# Patient Record
Sex: Female | Born: 1950 | Race: Black or African American | Hispanic: No | Marital: Married | Smoking: Never smoker
Health system: Southern US, Community
[De-identification: ages and names within clinical notes are randomized; demographics above are authoritative.]

## PROBLEM LIST (undated history)

## (undated) DIAGNOSIS — I447 Left bundle-branch block, unspecified: Secondary | ICD-10-CM

## (undated) DIAGNOSIS — I10 Essential (primary) hypertension: Secondary | ICD-10-CM

## (undated) DIAGNOSIS — E119 Type 2 diabetes mellitus without complications: Secondary | ICD-10-CM

## (undated) HISTORY — PX: ABDOMINAL HYSTERECTOMY: SHX81

---

## 2004-08-28 ENCOUNTER — Ambulatory Visit: Payer: Self-pay | Admitting: Family Medicine

## 2004-09-23 ENCOUNTER — Ambulatory Visit: Payer: Self-pay | Admitting: Family Medicine

## 2005-12-26 ENCOUNTER — Emergency Department: Payer: Self-pay | Admitting: Unknown Physician Specialty

## 2005-12-27 ENCOUNTER — Emergency Department: Payer: Self-pay | Admitting: Emergency Medicine

## 2005-12-27 ENCOUNTER — Other Ambulatory Visit: Payer: Self-pay

## 2006-02-21 ENCOUNTER — Ambulatory Visit: Payer: Self-pay | Admitting: Family Medicine

## 2006-10-28 ENCOUNTER — Ambulatory Visit: Payer: Self-pay | Admitting: Family Medicine

## 2006-10-31 ENCOUNTER — Emergency Department: Payer: Self-pay | Admitting: Emergency Medicine

## 2006-10-31 ENCOUNTER — Other Ambulatory Visit: Payer: Self-pay
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
## 2007-08-04 ENCOUNTER — Ambulatory Visit: Payer: Self-pay | Admitting: Family Medicine

## 2008-01-21 ENCOUNTER — Emergency Department: Payer: Self-pay | Admitting: Emergency Medicine

## 2008-03-05 ENCOUNTER — Ambulatory Visit: Payer: Self-pay | Admitting: Family Medicine

## 2008-04-27 ENCOUNTER — Ambulatory Visit: Payer: Self-pay | Admitting: Family Medicine

## 2008-10-18 ENCOUNTER — Emergency Department: Payer: Self-pay | Admitting: Emergency Medicine

## 2010-03-24 ENCOUNTER — Ambulatory Visit: Payer: Self-pay | Admitting: Internal Medicine

## 2010-11-27 ENCOUNTER — Observation Stay (HOSPITAL_COMMUNITY)
Admission: EM | Admit: 2010-11-27 | Discharge: 2010-11-28 | Payer: Self-pay | Source: Home / Self Care | Attending: Cardiology | Admitting: Cardiology

## 2010-11-27 LAB — BASIC METABOLIC PANEL
BUN: 12 mg/dL (ref 6–23)
CO2: 24 mEq/L (ref 19–32)
Calcium: 9.3 mg/dL (ref 8.4–10.5)
Chloride: 109 mEq/L (ref 96–112)
Creatinine, Ser: 0.64 mg/dL (ref 0.4–1.2)
GFR calc Af Amer: >60 mL/min (ref >60)
GFR calc non Af Amer: >60 mL/min (ref >60)
Glucose, Bld: 90 mg/dL (ref 70–99)
Potassium: 4.1 mEq/L (ref 3.5–5.1)
Sodium: 140 mEq/L (ref 135–145)

## 2010-11-27 LAB — POCT CARDIAC MARKERS
CKMB, poc: <1 ng/mL — ABNORMAL LOW (ref 1.0–8.0)
CKMB, poc: <1 ng/mL — ABNORMAL LOW (ref 1.0–8.0)
Myoglobin, poc: 39.3 ng/mL (ref 12–200)
Myoglobin, poc: 48 ng/mL (ref 12–200)
Troponin i, poc: <0.05 ng/mL (ref 0.00–0.09)
Troponin i, poc: <0.05 ng/mL (ref 0.00–0.09)

## 2010-11-27 LAB — DIFFERENTIAL
Basophils Absolute: 0 10*3/uL (ref 0.0–0.1)
Basophils Relative: 0 % (ref 0–1)
Eosinophils Absolute: 0.3 10*3/uL (ref 0.0–0.7)
Eosinophils Relative: 6 % — ABNORMAL HIGH (ref 0–5)
Lymphocytes Relative: 36 % (ref 12–46)
Lymphs Abs: 1.8 10*3/uL (ref 0.7–4.0)
Monocytes Absolute: 0.2 10*3/uL (ref 0.1–1.0)
Monocytes Relative: 5 % (ref 3–12)
Neutro Abs: 2.6 10*3/uL (ref 1.7–7.7)
Neutrophils Relative %: 53 % (ref 43–77)

## 2010-11-27 LAB — TROPONIN I: Troponin I: 0.02 ng/mL (ref 0.00–0.06)

## 2010-11-27 LAB — CK TOTAL AND CKMB (NOT AT ARMC)
CK, MB: 0.8 ng/mL (ref 0.3–4.0)
Relative Index: INVALID (ref 0.0–2.5)
Total CK: 60 U/L (ref 7–177)

## 2010-11-27 LAB — CBC
HCT: 37 % (ref 36.0–46.0)
Hemoglobin: 11.9 g/dL — ABNORMAL LOW (ref 12.0–15.0)
MCH: 30.4 pg (ref 26.0–34.0)
MCHC: 32.2 g/dL (ref 30.0–36.0)
MCV: 94.4 fL (ref 78.0–100.0)
Platelets: 214 10*3/uL (ref 150–400)
RBC: 3.92 MIL/uL (ref 3.87–5.11)
RDW: 13 % (ref 11.5–15.5)
WBC: 4.9 10*3/uL (ref 4.0–10.5)

## 2010-11-27 LAB — GLUCOSE, CAPILLARY: Glucose-Capillary: 172 mg/dL — ABNORMAL HIGH (ref 70–99)

## 2010-11-27 LAB — BRAIN NATRIURETIC PEPTIDE: Pro B Natriuretic peptide (BNP): <30 pg/mL (ref 0.0–100.0)

## 2010-11-28 LAB — LIPID PANEL
Cholesterol: 194 mg/dL (ref 0–200)
HDL: 44 mg/dL (ref >39)
LDL Cholesterol: 134 mg/dL — ABNORMAL HIGH (ref 0–99)
Total CHOL/HDL Ratio: 4.4 RATIO
Triglycerides: 78 mg/dL (ref <150)
VLDL: 16 mg/dL (ref 0–40)

## 2010-11-28 LAB — BASIC METABOLIC PANEL
BUN: 15 mg/dL (ref 6–23)
CO2: 25 mEq/L (ref 19–32)
Calcium: 9 mg/dL (ref 8.4–10.5)
Chloride: 108 mEq/L (ref 96–112)
Creatinine, Ser: 0.72 mg/dL (ref 0.4–1.2)
GFR calc Af Amer: >60 mL/min (ref >60)
GFR calc non Af Amer: >60 mL/min (ref >60)
Glucose, Bld: 121 mg/dL — ABNORMAL HIGH (ref 70–99)
Potassium: 4 mEq/L (ref 3.5–5.1)
Sodium: 142 mEq/L (ref 135–145)

## 2010-11-28 LAB — CARDIAC PANEL(CRET KIN+CKTOT+MB+TROPI)
CK, MB: 1 ng/mL (ref 0.3–4.0)
CK, MB: 0.8 ng/mL (ref 0.3–4.0)
Relative Index: INVALID (ref 0.0–2.5)
Relative Index: INVALID (ref 0.0–2.5)
Total CK: 56 U/L (ref 7–177)
Total CK: 54 U/L (ref 7–177)
Troponin I: 0.02 ng/mL (ref 0.00–0.06)
Troponin I: 0.01 ng/mL (ref 0.00–0.06)

## 2010-11-28 LAB — TSH: TSH: 2.455 u[IU]/mL (ref 0.350–4.500)

## 2010-11-28 LAB — GLUCOSE, CAPILLARY
Glucose-Capillary: 116 mg/dL — ABNORMAL HIGH (ref 70–99)
Glucose-Capillary: 129 mg/dL — ABNORMAL HIGH (ref 70–99)
Glucose-Capillary: 150 mg/dL — ABNORMAL HIGH (ref 70–99)

## 2010-11-28 LAB — CBC
HCT: 34.7 % — ABNORMAL LOW (ref 36.0–46.0)
Hemoglobin: 11.4 g/dL — ABNORMAL LOW (ref 12.0–15.0)
MCH: 31.1 pg (ref 26.0–34.0)
MCHC: 32.9 g/dL (ref 30.0–36.0)
MCV: 94.8 fL (ref 78.0–100.0)
Platelets: 212 10*3/uL (ref 150–400)
RBC: 3.66 MIL/uL — ABNORMAL LOW (ref 3.87–5.11)
RDW: 13.1 % (ref 11.5–15.5)
WBC: 4.1 10*3/uL (ref 4.0–10.5)

## 2010-12-01 ENCOUNTER — Ambulatory Visit (HOSPITAL_COMMUNITY)
Admission: RE | Admit: 2010-12-01 | Discharge: 2010-12-01 | Payer: Self-pay | Source: Home / Self Care | Attending: Cardiology | Admitting: Cardiology

## 2010-12-08 LAB — GLUCOSE, CAPILLARY: Glucose-Capillary: 104 mg/dL — ABNORMAL HIGH (ref 70–99)

## 2011-08-19 ENCOUNTER — Emergency Department: Payer: Self-pay | Admitting: Unknown Physician Specialty

## 2012-01-28 ENCOUNTER — Ambulatory Visit: Payer: Self-pay | Admitting: Internal Medicine

## 2012-02-09 ENCOUNTER — Ambulatory Visit: Payer: Self-pay | Admitting: Internal Medicine

## 2012-03-09 ENCOUNTER — Ambulatory Visit: Payer: Self-pay | Admitting: Surgery

## 2012-06-16 ENCOUNTER — Emergency Department: Payer: Self-pay | Admitting: *Deleted

## 2012-06-16 LAB — COMPREHENSIVE METABOLIC PANEL
Albumin: 3.8 g/dL (ref 3.4–5.0)
Alkaline Phosphatase: 83 U/L (ref 50–136)
Anion Gap: 9 (ref 7–16)
BUN: 15 mg/dL (ref 7–18)
Bilirubin,Total: 0.5 mg/dL (ref 0.2–1.0)
Calcium, Total: 8.9 mg/dL (ref 8.5–10.1)
Chloride: 108 mmol/L — ABNORMAL HIGH (ref 98–107)
Co2: 25 mmol/L (ref 21–32)
Creatinine: 0.83 mg/dL (ref 0.60–1.30)
EGFR (African American): >60
EGFR (Non-African Amer.): >60
Glucose: 203 mg/dL — ABNORMAL HIGH (ref 65–99)
Osmolality: 290 (ref 275–301)
Potassium: 3.5 mmol/L (ref 3.5–5.1)
SGOT(AST): 20 U/L (ref 15–37)
SGPT (ALT): 24 U/L
Sodium: 142 mmol/L (ref 136–145)
Total Protein: 7.2 g/dL (ref 6.4–8.2)

## 2012-06-16 LAB — URINALYSIS, COMPLETE
Bilirubin,UR: NEGATIVE
Blood: NEGATIVE
Glucose,UR: 150 mg/dL (ref 0–75)
Hyaline Cast: 3
Nitrite: NEGATIVE
Ph: 5 (ref 4.5–8.0)
Protein: NEGATIVE
RBC,UR: 2 /HPF (ref 0–5)
Specific Gravity: 1.028 (ref 1.003–1.030)
Squamous Epithelial: 6
WBC UR: 9 /HPF (ref 0–5)

## 2012-06-16 LAB — CBC
HCT: 35.7 % (ref 35.0–47.0)
HGB: 12.2 g/dL (ref 12.0–16.0)
MCH: 32.5 pg (ref 26.0–34.0)
MCHC: 34.3 g/dL (ref 32.0–36.0)
MCV: 95 fL (ref 80–100)
Platelet: 228 10*3/uL (ref 150–440)
RBC: 3.76 10*6/uL — ABNORMAL LOW (ref 3.80–5.20)
RDW: 13.1 % (ref 11.5–14.5)
WBC: 5.5 10*3/uL (ref 3.6–11.0)

## 2012-06-16 LAB — TROPONIN I: Troponin-I: <0.02 ng/mL

## 2012-11-05 ENCOUNTER — Emergency Department: Payer: Self-pay | Admitting: Emergency Medicine

## 2012-11-05 LAB — CBC
HCT: 39.8 % (ref 35.0–47.0)
HGB: 13.8 g/dL (ref 12.0–16.0)
MCH: 32.8 pg (ref 26.0–34.0)
MCV: 94 fL (ref 80–100)
Platelet: 185 10*3/uL (ref 150–440)
RBC: 4.22 10*6/uL (ref 3.80–5.20)
RDW: 13.1 % (ref 11.5–14.5)
WBC: 5.7 10*3/uL (ref 3.6–11.0)

## 2012-11-05 LAB — URINALYSIS, COMPLETE
Blood: NEGATIVE
Glucose,UR: >=500 mg/dL (ref 0–75)
Hyaline Cast: 4
Ph: 5 (ref 4.5–8.0)
Protein: NEGATIVE
Specific Gravity: 1.03 (ref 1.003–1.030)

## 2012-11-05 LAB — COMPREHENSIVE METABOLIC PANEL
Albumin: 3.8 g/dL (ref 3.4–5.0)
Alkaline Phosphatase: 103 U/L (ref 50–136)
Anion Gap: 9 (ref 7–16)
BUN: 26 mg/dL — ABNORMAL HIGH (ref 7–18)
Bilirubin,Total: 0.4 mg/dL (ref 0.2–1.0)
Calcium, Total: 9.1 mg/dL (ref 8.5–10.1)
Chloride: 106 mmol/L (ref 98–107)
Co2: 22 mmol/L (ref 21–32)
Creatinine: 0.94 mg/dL (ref 0.60–1.30)
EGFR (African American): >60
EGFR (Non-African Amer.): >60
Glucose: 387 mg/dL — ABNORMAL HIGH (ref 65–99)
Osmolality: 295 (ref 275–301)
Potassium: 4.2 mmol/L (ref 3.5–5.1)
SGOT(AST): 17 U/L (ref 15–37)
SGPT (ALT): 22 U/L (ref 12–78)
Sodium: 137 mmol/L (ref 136–145)
Total Protein: 7.3 g/dL (ref 6.4–8.2)

## 2012-11-05 LAB — TROPONIN I: Troponin-I: <0.02 ng/mL

## 2012-11-05 LAB — CK TOTAL AND CKMB (NOT AT ARMC)
CK, Total: 57 U/L (ref 21–215)
CK-MB: 0.6 ng/mL (ref 0.5–3.6)

## 2013-12-13 ENCOUNTER — Ambulatory Visit: Payer: Self-pay | Admitting: Family Medicine

## 2014-04-13 ENCOUNTER — Ambulatory Visit: Payer: Self-pay | Admitting: Internal Medicine

## 2015-09-24 ENCOUNTER — Other Ambulatory Visit: Payer: Self-pay | Admitting: Internal Medicine

## 2015-09-24 DIAGNOSIS — R1013 Epigastric pain: Secondary | ICD-10-CM

## 2015-09-30 ENCOUNTER — Ambulatory Visit
Admission: RE | Admit: 2015-09-30 | Discharge: 2015-09-30 | Disposition: A | Discharge status: Home / Self Care | Payer: No Typology Code available for payment source | Source: Ambulatory Visit | Attending: Internal Medicine | Admitting: Internal Medicine

## 2015-09-30 DIAGNOSIS — R1013 Epigastric pain: Secondary | ICD-10-CM

## 2015-10-02 ENCOUNTER — Ambulatory Visit
Admission: RE | Admit: 2015-10-02 | Discharge: 2015-10-02 | Disposition: A | Discharge status: Home / Self Care | Payer: No Typology Code available for payment source | Source: Ambulatory Visit | Attending: Internal Medicine | Admitting: Internal Medicine

## 2015-10-02 DIAGNOSIS — N281 Cyst of kidney, acquired: Secondary | ICD-10-CM | POA: Diagnosis not present

## 2015-10-02 DIAGNOSIS — R1013 Epigastric pain: Secondary | ICD-10-CM | POA: Diagnosis present

## 2016-05-05 ENCOUNTER — Other Ambulatory Visit: Payer: Self-pay | Admitting: Internal Medicine

## 2016-05-05 DIAGNOSIS — R1900 Intra-abdominal and pelvic swelling, mass and lump, unspecified site: Secondary | ICD-10-CM

## 2016-05-13 ENCOUNTER — Ambulatory Visit
Admission: RE | Admit: 2016-05-13 | Discharge: 2016-05-13 | Disposition: A | Discharge status: Home / Self Care | Payer: BLUE CROSS/BLUE SHIELD | Source: Ambulatory Visit | Attending: Internal Medicine | Admitting: Internal Medicine

## 2016-05-13 DIAGNOSIS — R1900 Intra-abdominal and pelvic swelling, mass and lump, unspecified site: Secondary | ICD-10-CM | POA: Diagnosis not present

## 2016-09-02 ENCOUNTER — Emergency Department: Payer: BLUE CROSS/BLUE SHIELD

## 2016-09-02 ENCOUNTER — Emergency Department
Admission: EM | Admit: 2016-09-02 | Discharge: 2016-09-02 | Disposition: A | Discharge status: Home / Self Care | Payer: BLUE CROSS/BLUE SHIELD | Attending: Emergency Medicine | Admitting: Emergency Medicine

## 2016-09-02 ENCOUNTER — Encounter: Payer: Self-pay | Admitting: Emergency Medicine

## 2016-09-02 DIAGNOSIS — R072 Precordial pain: Secondary | ICD-10-CM | POA: Insufficient documentation

## 2016-09-02 DIAGNOSIS — R51 Headache: Secondary | ICD-10-CM | POA: Diagnosis not present

## 2016-09-02 DIAGNOSIS — E119 Type 2 diabetes mellitus without complications: Secondary | ICD-10-CM | POA: Diagnosis not present

## 2016-09-02 DIAGNOSIS — R079 Chest pain, unspecified: Secondary | ICD-10-CM

## 2016-09-02 DIAGNOSIS — I10 Essential (primary) hypertension: Secondary | ICD-10-CM | POA: Insufficient documentation

## 2016-09-02 HISTORY — DX: Essential (primary) hypertension: I10

## 2016-09-02 HISTORY — DX: Type 2 diabetes mellitus without complications: E11.9

## 2016-09-02 LAB — BASIC METABOLIC PANEL
ANION GAP: 6 (ref 5–15)
BUN: 21 mg/dL — ABNORMAL HIGH (ref 6–20)
CALCIUM: 9.4 mg/dL (ref 8.9–10.3)
CHLORIDE: 108 mmol/L (ref 101–111)
CO2: 24 mmol/L (ref 22–32)
Creatinine, Ser: 0.81 mg/dL (ref 0.44–1.00)
GFR calc Af Amer: >60 mL/min (ref >60)
GFR calc non Af Amer: >60 mL/min (ref >60)
GLUCOSE: 221 mg/dL — AB (ref 65–99)
POTASSIUM: 4 mmol/L (ref 3.5–5.1)
Sodium: 138 mmol/L (ref 135–145)

## 2016-09-02 LAB — CBC
HEMATOCRIT: 38.7 % (ref 35.0–47.0)
HEMOGLOBIN: 13.3 g/dL (ref 12.0–16.0)
MCH: 32.5 pg (ref 26.0–34.0)
MCHC: 34.3 g/dL (ref 32.0–36.0)
MCV: 94.8 fL (ref 80.0–100.0)
Platelets: 217 10*3/uL (ref 150–440)
RBC: 4.08 MIL/uL (ref 3.80–5.20)
RDW: 12.8 % (ref 11.5–14.5)
WBC: 5.3 10*3/uL (ref 3.6–11.0)

## 2016-09-02 LAB — TROPONIN I
Troponin I: <0.03 ng/mL (ref <0.03)
Troponin I: <0.03 ng/mL (ref <0.03)

## 2016-09-02 MED ORDER — NITROGLYCERIN 0.4 MG SL SUBL
0.4000 mg | SUBLINGUAL_TABLET | SUBLINGUAL | Status: DC | PRN
  Administered 2016-09-02: 0.4 mg via SUBLINGUAL
  Filled 2016-09-02: qty 1

## 2016-09-02 MED ORDER — ACETAMINOPHEN 325 MG PO TABS
ORAL_TABLET | ORAL | Status: AC
  Administered 2016-09-02: 650 mg via ORAL
  Filled 2016-09-02: qty 2

## 2016-09-02 MED ORDER — ACETAMINOPHEN 325 MG PO TABS
650.0000 mg | ORAL_TABLET | Freq: Once | ORAL | Status: AC
  Administered 2016-09-02: 650 mg via ORAL

## 2016-09-02 NOTE — ED Notes (Signed)
At present, pt is refusing to take the nitro tablet, pt states she wants to speak with her daughter first.. MD notified..will speak with pt after she has a chance to speak with her daughter..Marland Kitchen

## 2016-09-02 NOTE — ED Triage Notes (Signed)
Pt presents to ED c/o headache, chest pain, and R arm pain starting about 5 hours ago. Chest pain is 6/10, mid-sternal, non-radiating, worse when laying down. Pt states she thinks it is stress related.

## 2016-09-02 NOTE — ED Notes (Signed)
Pt informed to return if any life threatening symptoms occur.  

## 2016-09-02 NOTE — ED Provider Notes (Signed)
[facility] Emergency Department Provider Note   ____________________________________________   First MD Initiated Contact with Patient 09/02/16 1401     (approximate)  I have reviewed the triage vital signs and the nursing notes.   HISTORY  Chief Complaint Headache and Chest Pain   HPI Gina Hudson is a 65 y.o. female with a history of diabetes and hypertension as well as a left bundle-branch block was presenting to the emergency department today with chest pain. She says that she was very stressed this morning and was crying when she began to have a 6 out of 10 chest pain to the right side of her chest radiating to her right arm as well as her bilateral shoulders. She says that she also developed a mild headache at this time. Says the chest pain feels like a pressure. Denies any shortness of breath but says that she felt clammy during the onset of her chest pain. She says that it was initially 6 out of 10 but has now decreased to 5 out of 10. Initially she was not able to lie down because of the intensity of the pain but is now able to lie down. Has a history of heart attacks in her family. Says that she knows of her left bundle branch block and has had stress tests in the past that did not lead to any catheterization but the stress tests were about 5-6 years ago. She says the pain is been constant but has been decreasing over time.   Past Medical History:  Diagnosis Date  . Diabetes mellitus without complication (HCC)    type II  . Hypertension     There are no active problems to display for this patient.   Past Surgical History:  Procedure Laterality Date  . ABDOMINAL HYSTERECTOMY      Prior to Admission medications   Not on File    Allergies Prednisone  History reviewed. No pertinent family history.  Social History Social History  Substance Use Topics  . Smoking status: Never Smoker  . Smokeless tobacco: Never Used  . Alcohol use Yes       Comment: holidays    Review of Systems Constitutional: No fever/chills Eyes: No visual changes. ENT: No sore throat. Cardiovascular: as above Respiratory: Denies shortness of breath. Gastrointestinal: No abdominal pain.  No nausea, no vomiting.  No diarrhea.  No constipation. Genitourinary: Negative for dysuria. Musculoskeletal: Negative for back pain. Skin: Negative for rash. Neurological: Negative for, focal weakness or numbness. Mild and diffuse headache.  10-point ROS otherwise negative.  ____________________________________________   PHYSICAL EXAM:  VITAL SIGNS: ED Triage Vitals  Enc Vitals Group     BP 09/02/16 1320 136/67     Pulse Rate 09/02/16 1320 85     Resp 09/02/16 1320 18     Temp 09/02/16 1320 98.7 F (37.1 C)     Temp Source 09/02/16 1320 Oral     SpO2 09/02/16 1320 96 %     Weight 09/02/16 1321 195 lb (88.5 kg)     Height 09/02/16 1321 5\' 1"  (1.549 m)     Head Circumference --      Peak Flow --      Pain Score 09/02/16 1331 6     Pain Loc --      Pain Edu? --      Excl. in GC? --     Constitutional: Alert and oriented. Well appearing and in no acute distress. Eyes: Conjunctivae are normal. PERRL.  EOMI. Head: Atraumatic. Nose: No congestion/rhinnorhea. Mouth/Throat: Mucous membranes are moist.   Neck: No stridor.   Cardiovascular: Normal rate, regular rhythm. Grossly normal heart sounds.  Good peripheral circulation With intact, equal and bilateral radial as well as dorsalis pedis pulses. Chest pain is mildly reproducible to palpation. Respiratory: Normal respiratory effort.  No retractions. Lungs CTAB. Gastrointestinal: Soft and nontender. No distention.  Musculoskeletal: No lower extremity tenderness nor edema.  No joint effusions. Neurologic:  Normal speech and language. No gross focal neurologic deficits are appreciated. Skin:  Skin is warm, dry and intact. No rash noted. Psychiatric: Mood and affect are normal. Speech and behavior are  normal.  ____________________________________________   LABS (all labs ordered are listed, but only abnormal results are displayed)  Labs Reviewed  BASIC METABOLIC PANEL - Abnormal; Notable for the following:       Result Value   Glucose, Bld 221 (*)    BUN 21 (*)    All other components within normal limits  CBC  TROPONIN I  TROPONIN I   ____________________________________________  EKG  ED ECG REPORT I, Arelia LongestSchaevitz,  Pantera Winterrowd M, the attending physician, personally viewed and interpreted this ECG.   Date: 09/02/2016  EKG Time: 1320  Rate: 84  Rhythm: Normal sinus rhythm  Axis: Normal  Intervals:left bundle branch block  ST&T Change: T wave inversions in 1, 2, aVL as well as aVF. The T-wave inversions in 2 as well as aVF are new from previous. Also with a T-wave inversion in V6. T-wave inversion in V6 is seen on previous EKGs.  ____________________________________________  RADIOLOGY  DG Chest 2 View (Accession [id number]) (Order [id number])  Imaging  Date: 09/02/2016 Department: [facility] EMERGENCY DEPARTMENT Released By: Macario GoldsAlicia A Granger, RN (auto-released) Authorizing: Myrna Blazeravid Matthew Levonte Molina, MD  Exam Information   Status Exam Begun  Exam Ended   Final [99] 09/02/2016 1:57 PM 09/02/2016 2:12 PM  PACS Images   Show images for DG Chest 2 View  Study Result   CLINICAL DATA:  Pt presents to ED c/o headache, chest pain, and R arm pain starting about 5 hours ago. Chest pain is 6/10, mid-sternal, non-radiating, worse when laying down. Pt states she thinks it is stress related. Diabetes, HTN, nonsmoker  EXAM: CHEST - 2 VIEW  COMPARISON:  08/19/2011  FINDINGS: Low lung volumes. No focal infiltrate or overt edema. No pneumothorax.  Heart size upper limits normal.  Tortuous thoracic aorta.  No effusion.  Visualized bones unremarkable.  IMPRESSION: 1. Stable mild cardiomegaly.  No acute disease.   Electronically Signed   By:  Corlis Leak  Hassell M.D.   On: 09/02/2016 14:17     ____________________________________________   PROCEDURES  Procedure(s) performed:   Procedures  Critical Care performed:   ____________________________________________   INITIAL IMPRESSION / ASSESSMENT AND PLAN / ED COURSE  Pertinent labs & imaging results that were available during my care of the patient were reviewed by me and considered in my medical decision making (see chart for details).  ----------------------------------------- 3:13 PM on 09/02/2016 -----------------------------------------  Plan was to give patient nitroglycerin the patient is refusing at this time.  Clinical Course  Comment By Time  Patient took nitroglycerin tablet and says that the pain went from a 3 out of 10 to a 1 or 2 out of 10. Myrna Blazeravid Matthew Tomma Ehinger, MD 10/11 1547   ----------------------------------------- 5:13 PM on 09/02/2016 -----------------------------------------  After Tylenol the patient is symptom-free. She denies any pain at this time. I discussed the case with  Dr. Welton Flakes, her cardiologist. We discussed the new T-wave inversions in the context of her history physical and lab results. Dr. Lennette Bihari agrees that it will be appropriate for the patient to be seen as an outpatient and will see her at 845 tomorrow morning. I discussed this plan with the patient and she is understanding of the plan and willing to comply. We also discussed strict return precautions and that she should return to the hospital immediately for any worsening or concerning symptoms.   ____________________________________________   FINAL CLINICAL IMPRESSION(S) / ED DIAGNOSES  Chest pain.    NEW MEDICATIONS STARTED DURING THIS VISIT:  New Prescriptions   No medications on file     Note:  This document was prepared using Dragon voice recognition software and may include unintentional dictation errors.    Myrna Blazer, MD 09/02/16 1714

## 2017-04-02 ENCOUNTER — Ambulatory Visit: Payer: BLUE CROSS/BLUE SHIELD | Attending: Internal Medicine

## 2017-04-05 ENCOUNTER — Other Ambulatory Visit: Payer: Self-pay | Admitting: Internal Medicine

## 2017-04-05 DIAGNOSIS — N390 Urinary tract infection, site not specified: Secondary | ICD-10-CM

## 2017-04-09 ENCOUNTER — Other Ambulatory Visit: Payer: Self-pay | Admitting: Internal Medicine

## 2017-04-09 ENCOUNTER — Ambulatory Visit
Admission: RE | Admit: 2017-04-09 | Discharge: 2017-04-09 | Disposition: A | Discharge status: Home / Self Care | Payer: Medicare HMO | Source: Ambulatory Visit | Attending: Internal Medicine | Admitting: Internal Medicine

## 2017-04-09 DIAGNOSIS — N390 Urinary tract infection, site not specified: Secondary | ICD-10-CM

## 2017-04-09 DIAGNOSIS — N9489 Other specified conditions associated with female genital organs and menstrual cycle: Secondary | ICD-10-CM | POA: Insufficient documentation

## 2017-04-26 ENCOUNTER — Other Ambulatory Visit: Payer: Self-pay

## 2017-05-18 ENCOUNTER — Ambulatory Visit: Payer: Self-pay | Admitting: Urology

## 2017-06-08 ENCOUNTER — Encounter: Payer: Self-pay | Admitting: Urology

## 2017-06-08 ENCOUNTER — Ambulatory Visit: Payer: Medicare HMO | Admitting: Urology

## 2017-06-08 VITALS — BP 142/99 | HR 75 | Ht 62.0 in | Wt 195.7 lb

## 2017-06-08 DIAGNOSIS — N941 Unspecified dyspareunia: Secondary | ICD-10-CM | POA: Diagnosis not present

## 2017-06-08 DIAGNOSIS — N1339 Other hydronephrosis: Secondary | ICD-10-CM

## 2017-06-08 DIAGNOSIS — N390 Urinary tract infection, site not specified: Secondary | ICD-10-CM

## 2017-06-08 LAB — URINALYSIS, COMPLETE
BILIRUBIN UA: NEGATIVE
Glucose, UA: NEGATIVE
KETONES UA: NEGATIVE
LEUKOCYTES UA: NEGATIVE
NITRITE UA: NEGATIVE
Protein, UA: NEGATIVE
RBC UA: NEGATIVE
SPEC GRAV UA: 1.015 (ref 1.005–1.030)
UUROB: 0.2 mg/dL (ref 0.2–1.0)
pH, UA: 5.5 (ref 5.0–7.5)

## 2017-06-08 LAB — MICROSCOPIC EXAMINATION
Bacteria, UA: NONE SEEN
Epithelial Cells (non renal): >10 /hpf — ABNORMAL HIGH (ref 0–10)
RBC MICROSCOPIC, UA: NONE SEEN /HPF (ref 0–?)
WBC UA: NONE SEEN /HPF (ref 0–?)

## 2017-06-08 LAB — BLADDER SCAN AMB NON-IMAGING: SCAN RESULT: 9

## 2017-06-08 NOTE — Progress Notes (Signed)
06/08/2017 10:38 AM   Gina Hudson Mar 02, 1951 [id number]  Referring provider: Hyman HopesBurns, Harriett P, MD [address] [place], [place] [id number]  Chief Complaint  Patient presents with  . Urinary Tract Infection    New Patient    HPI: Patient referred for lower urinary tract symptoms. Going for about a year. She does not have dysuria but more of a spasm type pain that radiates down into her hands. She complains of urinary frequency and nocturia. She also has abdominal pain, pelvic pain and dyspareunia. On review of the referral notes I didn't see the urine culture but it is described as resistant to Cipro and Bactrim and therefore doxycycline was prescribed. Most of the time abx help. Sex seems to precipitate UTI. Her UA today is normal. She did undergo a renal ultrasound and I reviewed the images. She may have bilateral parapelvic cysts but it was difficult to differentiate that from hydronephrosis simply on the ultrasound. There were bilateral ureteral jets. No gross hematuria or flank pain.   Her freq and urgency is better today. She also refrained from sexual activity which seems to have helped. No other prior treatments. She has a "bloated stomach".  She has had a hx. No prolapse symptoms. No NG risk. No PN (has IDDM).   Modifying factors: There are no other modifying factors  Associated signs and symptoms: There are no other associated signs and symptoms Aggravating and relieving factors: There are no other aggravating or relieving factors Severity: Moderate Duration: Persistent  PVR today 9 mL.    PMH: Past Medical History:  Diagnosis Date  . Diabetes mellitus without complication (HCC)    type II  . Hypertension     Surgical History: Past Surgical History:  Procedure Laterality Date  . ABDOMINAL HYSTERECTOMY      Home Medications:  Allergies as of 06/08/2017      Reactions   Prednisone Other (See Comments)   hyperglycemia      Medication List         Accurate as of 06/08/17 10:38 AM. Always use your most recent med list.          aspirin EC 81 MG tablet Take 1 tablet by mouth daily.   insulin aspart 100 UNIT/ML FlexPen Commonly known as:  NOVOLOG Inject 3 Units into the skin daily at 12 noon.   LEVEMIR FLEXTOUCH [place] Inject 15 Units into the skin at bedtime.   losartan 100 MG tablet Commonly known as:  COZAAR Take 1 tablet by mouth daily.   metFORMIN 1000 MG tablet Commonly known as:  GLUCOPHAGE Take 1 tablet by mouth 2 (two) times daily.   metoprolol succinate 100 MG 24 hr tablet Commonly known as:  TOPROL-XL Take 1 tablet by mouth daily.   spironolactone 50 MG tablet Commonly known as:  ALDACTONE Take 1 tablet by mouth daily.       Allergies:  Allergies  Allergen Reactions  . Prednisone Other (See Comments)    hyperglycemia    Family History: No family history on file.  Social History:  reports that she has never smoked. She has never used smokeless tobacco. She reports that she drinks alcohol. She reports that she does not use drugs.  ROS: UROLOGY Frequent Urination?: Yes Hard to postpone urination?: No Burning/pain with urination?: No Get up at night to urinate?: Yes Leakage of urine?: No Urine stream starts and stops?: No Trouble starting stream?: No Do you have to strain to urinate?: No Blood in urine?:  No Urinary tract infection?: No Sexually transmitted disease?: No Injury to kidneys or bladder?: No Painful intercourse?: No Weak stream?: Yes Currently pregnant?: No Vaginal bleeding?: No Last menstrual period?: n  Gastrointestinal Nausea?: No Vomiting?: No Indigestion/heartburn?: No Diarrhea?: No Constipation?: No  Constitutional Fever: No Night sweats?: No Weight loss?: No Fatigue?: No  Skin Skin rash/lesions?: No Itching?: No  Eyes Blurred vision?: No Double vision?: No  Ears/Nose/Throat Sore throat?: No Sinus problems?: No  Hematologic/Lymphatic Swollen glands?:  No Easy bruising?: No  Cardiovascular Leg swelling?: Yes Chest pain?: Yes  Respiratory Cough?: No Shortness of breath?: No  Endocrine Excessive thirst?: No  Musculoskeletal Back pain?: No Joint pain?: No  Neurological Headaches?: No Dizziness?: No  Psychologic Depression?: No Anxiety?: No  Physical Exam: BP (!) 142/99 (BP Location: Left Arm, Patient Position: Sitting, Cuff Size: Large)   Pulse 75   Ht 5\' 2"  (1.575 m)   Wt 88.8 kg (195 lb 11.2 oz)   BMI 35.79 kg/m   Constitutional:  Alert and oriented, No acute distress. HEENT: [place] AT, moist mucus membranes.  Trachea midline, no masses. Cardiovascular: No clubbing, cyanosis, or edema. Respiratory: Normal respiratory effort, no increased work of breathing. GI: Abdomen is soft, nontender, nondistended, no abdominal masses GU: No CVA tenderness. Skin: No rashes, bruises or suspicious lesions. Lymph: No cervical adenopathy. Neurologic: Grossly intact, no focal deficits, moving all 4 extremities. Psychiatric: Normal mood and affect.  Laboratory Data: Lab Results  Component Value Date   WBC 5.3 09/02/2016   HGB 13.3 09/02/2016   HCT 38.7 09/02/2016   MCV 94.8 09/02/2016   PLT 217 09/02/2016    Lab Results  Component Value Date   CREATININE 0.81 09/02/2016    No results found for: PSA  No results found for: TESTOSTERONE  No results found for: HGBA1C  Urinalysis    Component Value Date/Time   COLORURINE Yellow 11/05/2012 1926   APPEARANCEUR Cloudy 11/05/2012 1926   LABSPEC 1.030 11/05/2012 1926   PHURINE 5.0 11/05/2012 1926   GLUCOSEU >=500 11/05/2012 1926   HGBUR Negative 11/05/2012 1926   BILIRUBINUR Negative 11/05/2012 1926   KETONESUR Trace 11/05/2012 1926   PROTEINUR Negative 11/05/2012 1926   NITRITE Negative 11/05/2012 1926   LEUKOCYTESUR Negative 11/05/2012 1926    Pertinent Imaging: Renal US   Assessment & Plan:   1. Recurrent UTI - resolved with antibiotic use. - Urinalysis,  Complete - BLADDER SCAN AMB NON-IMAGING  2. LUTS, dyspareunia-may or may not be related to UTI. Will monitor. She should come here for "UTI" symptoms so we can start tracking her UA and Cx. She would also benefit from pelvic floor PT and I'll let her PCP know. We could also consider post-coital abx if clearly related to sex but we would want some Cx data.   3. Hydronephrosis-pt asked about this and we discussed the nature r/b of [place] vs CT. We also discussed retrogrades. Will set her up for a CT scan with IV contrast.  Cc: Dr. Lawerance Bach   No Follow-up on file.  Jerilee Field, MD  [facility] [address], [address] [place], [place] [phone]

## 2017-06-23 ENCOUNTER — Ambulatory Visit
Admission: RE | Admit: 2017-06-23 | Discharge: 2017-06-23 | Disposition: A | Discharge status: Home / Self Care | Payer: Medicare HMO | Source: Ambulatory Visit | Attending: Urology | Admitting: Urology

## 2017-06-23 DIAGNOSIS — I700 Atherosclerosis of aorta: Secondary | ICD-10-CM | POA: Diagnosis not present

## 2017-06-23 DIAGNOSIS — Z90710 Acquired absence of both cervix and uterus: Secondary | ICD-10-CM | POA: Insufficient documentation

## 2017-06-23 DIAGNOSIS — N281 Cyst of kidney, acquired: Secondary | ICD-10-CM | POA: Insufficient documentation

## 2017-06-23 DIAGNOSIS — N390 Urinary tract infection, site not specified: Secondary | ICD-10-CM | POA: Insufficient documentation

## 2017-06-23 DIAGNOSIS — N941 Unspecified dyspareunia: Secondary | ICD-10-CM | POA: Insufficient documentation

## 2017-06-23 DIAGNOSIS — N1339 Other hydronephrosis: Secondary | ICD-10-CM

## 2017-06-23 LAB — POCT I-STAT CREATININE: Creatinine, Ser: 1 mg/dL (ref 0.44–1.00)

## 2017-06-23 MED ORDER — IOPAMIDOL (ISOVUE-370) INJECTION 76%
100.0000 mL | Freq: Once | INTRAVENOUS | Status: AC | PRN
  Administered 2017-06-23: 100 mL via INTRAVENOUS

## 2017-07-08 ENCOUNTER — Ambulatory Visit: Payer: Medicare HMO | Admitting: Urology

## 2017-07-14 NOTE — Progress Notes (Deleted)
07/15/2017 10:20 PM   Gina Hudson 26-Dec-1950 [id number]  Referring provider: Hyman Hopes, MD [address] [place], [place] [address]  No chief complaint on file.   HPI: 66 yo *** female with a history of LU TS, recurrent UTI and hydronephrosis who presents today to discuss the results of her CT.    Background history Patient referred for lower urinary tract symptoms. Going for about a year. She does not have dysuria but more of a spasm type pain that radiates down into her hands. She complains of urinary frequency and nocturia. She also has abdominal pain, pelvic pain and dyspareunia. On review of the referral notes I didn't see the urine culture but it is described as resistant to Cipro and Bactrim and therefore doxycycline was prescribed. Most of the time abx help. Sex seems to precipitate UTI. Her UA today is normal. She did undergo a renal ultrasound and I reviewed the images. She may have bilateral parapelvic cysts but it was difficult to differentiate that from hydronephrosis simply on the ultrasound. There were bilateral ureteral jets. No gross hematuria or flank pain.  Her freq and urgency is better today. She also refrained from sexual activity which seems to have helped. No other prior treatments. She has a "bloated stomach".  She has had a hx. No prolapse symptoms. No NG risk. No PN (has IDDM).   Modifying factors: There are no other modifying factors  Associated signs and symptoms: There are no other associated signs and symptoms Aggravating and relieving factors: There are no other aggravating or relieving factors Severity: Moderate Duration: Persistent  PVR today 9 mL.   CTU completed on 06/23/2017 noted no evidence of renal mass.  Bilateral parapelvic cysts.  No hydronephrosis.  Aortic Atherosclerosis (ICD10-I70.0).  Today, she has been experiencing urgency x *** (***), frequency x *** (***), not/is restricting fluids to avoid visits to the restroom ***,  not/is engaging in toilet mapping, incontinence x *** (***) and nocturia x *** (***).  Her PVR is ***  PMH: Past Medical History:  Diagnosis Date  . Diabetes mellitus without complication (HCC)    type II  . Hypertension     Surgical History: Past Surgical History:  Procedure Laterality Date  . ABDOMINAL HYSTERECTOMY      Home Medications:  Allergies as of 07/15/2017      Reactions   Prednisone Other (See Comments)   hyperglycemia      Medication List       Accurate as of 07/14/17 10:20 PM. Always use your most recent med list.          aspirin EC 81 MG tablet Take 1 tablet by mouth daily.   insulin aspart 100 UNIT/ML FlexPen Commonly known as:  NOVOLOG Inject 3 Units into the skin daily at 12 noon.   LEVEMIR FLEXTOUCH [place] Inject 15 Units into the skin at bedtime.   losartan 100 MG tablet Commonly known as:  COZAAR Take 1 tablet by mouth daily.   metFORMIN 1000 MG tablet Commonly known as:  GLUCOPHAGE Take 1 tablet by mouth 2 (two) times daily.   metoprolol succinate 100 MG 24 hr tablet Commonly known as:  TOPROL-XL Take 1 tablet by mouth daily.   spironolactone 50 MG tablet Commonly known as:  ALDACTONE Take 1 tablet by mouth daily.       Allergies:  Allergies  Allergen Reactions  . Prednisone Other (See Comments)    hyperglycemia    Family History: Family History  Problem Relation Age of Onset  . Prostate cancer Father   . Cancer Mother     Social History:  reports that she has never smoked. She has never used smokeless tobacco. She reports that she drinks alcohol. She reports that she does not use drugs.  ROS:                                        Physical Exam: There were no vitals taken for this visit.  Constitutional:  Alert and oriented, No acute distress. HEENT: [place] AT, moist mucus membranes.  Trachea midline, no masses. Cardiovascular: No clubbing, cyanosis, or edema. Respiratory: Normal respiratory  effort, no increased work of breathing. GI: Abdomen is soft, nontender, nondistended, no abdominal masses GU: No CVA tenderness. Skin: No rashes, bruises or suspicious lesions. Lymph: No cervical adenopathy. Neurologic: Grossly intact, no focal deficits, moving all 4 extremities. Psychiatric: Normal mood and affect.  Laboratory Data: Lab Results  Component Value Date   WBC 5.3 09/02/2016   HGB 13.3 09/02/2016   HCT 38.7 09/02/2016   MCV 94.8 09/02/2016   PLT 217 09/02/2016    Lab Results  Component Value Date   CREATININE 1.00 06/23/2017    Urinalysis ***  Pertinent Imaging: CLINICAL DATA:  Indeterminate renal lesion on ultrasound. Bilateral flank pain.  EXAM: CT ABDOMEN AND PELVIS WITHOUT AND WITH CONTRAST  TECHNIQUE: Multidetector CT imaging of the abdomen and pelvis was performed following the standard protocol before and following the bolus administration of intravenous contrast.  CONTRAST:  100 cc Isovue  COMPARISON:  None.  FINDINGS: Lower chest: Lung bases are clear.  Hepatobiliary: No focal hepatic lesion. No biliary duct dilatation. Gallbladder is normal. Common bile duct is normal.  Pancreas: Pancreas is normal. No ductal dilatation. No pancreatic inflammation.  Spleen: Normal spleen  Adrenals/urinary tract: Adrenal glands are normal. No nephrolithiasis or ureterolithiasis. No enhancing renal cortical lesion. There are bilateral renal parapelvic cysts. No filling defects the collecting systems or ureters.  No bladder calculi, enhancing bladder lesions, or filling defect within the bladder.  Stomach/Bowel: Stomach, small bowel, appendix, and cecum are normal. The colon and rectosigmoid colon are normal.  Vascular/Lymphatic: Abdominal aorta is normal caliber with atherosclerotic calcification. There is no retroperitoneal or periportal lymphadenopathy. No pelvic lymphadenopathy.  Reproductive: Post hysterectomy.  No adnexal  abnormality  Other: No free fluid.  Musculoskeletal: No aggressive osseous lesion.  IMPRESSION: 1. No evidence of renal mass. 2. Bilateral parapelvic cysts.  No hydronephrosis. 3.  Aortic Atherosclerosis (ICD10-I70.0).   Electronically Signed   By: Genevive Bi M.D.   On: 06/24/2017 08:53   Assessment & Plan:   1. Recurrent UTI - resolved with antibiotic use. - Urinalysis, Complete - BLADDER SCAN AMB NON-IMAGING  2. LUTS, dyspareunia-may or may not be related to UTI. Will monitor. She should come here for "UTI" symptoms so we can start tracking her UA and Cx. She would also benefit from pelvic floor PT and I'll let her PCP know. We could also consider post-coital abx if clearly related to sex but we would want some Cx data.   3. Hydronephrosis-pt asked about this and we discussed the nature r/b of [place] vs CT. We also discussed retrogrades. Will set her up for a CT scan with IV contrast.  Cc: Dr. Lawerance Bach   No Follow-up on file.  Michiel Cowboy, PA-C  [facility] [address]  [address], [place], [place] [phone]

## 2017-07-15 ENCOUNTER — Ambulatory Visit: Payer: Medicare HMO | Admitting: Urology

## 2017-07-15 ENCOUNTER — Encounter: Payer: Self-pay | Admitting: Urology

## 2017-09-09 ENCOUNTER — Ambulatory Visit: Payer: Medicare HMO | Admitting: Urology

## 2017-12-14 ENCOUNTER — Other Ambulatory Visit: Payer: Self-pay

## 2017-12-16 ENCOUNTER — Encounter: Payer: Self-pay | Admitting: Urology

## 2017-12-16 ENCOUNTER — Ambulatory Visit: Payer: Medicare HMO | Admitting: Urology

## 2017-12-16 VITALS — BP 137/80 | HR 73 | Ht 62.0 in | Wt 200.3 lb

## 2017-12-16 DIAGNOSIS — N390 Urinary tract infection, site not specified: Secondary | ICD-10-CM

## 2017-12-16 LAB — URINALYSIS, COMPLETE
Bilirubin, UA: NEGATIVE
Glucose, UA: NEGATIVE
Ketones, UA: NEGATIVE
Leukocytes, UA: NEGATIVE
Nitrite, UA: NEGATIVE
Protein, UA: NEGATIVE
RBC, UA: NEGATIVE
Specific Gravity, UA: 1.025 (ref 1.005–1.030)
Urobilinogen, Ur: 0.2 mg/dL (ref 0.2–1.0)
pH, UA: 5.5 (ref 5.0–7.5)

## 2017-12-16 LAB — MICROSCOPIC EXAMINATION

## 2017-12-16 NOTE — Progress Notes (Signed)
12/16/2017 8:54 AM   Gina Hudson 03/16/1951 [id number]  Referring provider: Hyman Hopes, MD [address] [place], [place] [address]  Chief Complaint  Patient presents with  . Groin Pain  . Dysuria    HPI: The patient is a 67 year old female who presents today to discuss UTI symptoms.  She currently is asymptomatic.  Without dysuria.  She was treated apparently recently for urinary tract infection.  She does have an apparent history of recurrent urinary tract infections however this is not documented or at the very least I do not have access to these records..  Apparently on her initial referral she had a urine culture that was resistant to Cipro and Bactrim but sensitive to doxycycline.  When she gets UTI symptoms these include frequency, urgency and urge incontinence.  She does not get dysuria.  She did have a CT scan in August 2018 that was positive only for bilateral parapelvic cyst.  There is no hydronephrosis or stones.   PMH: Past Medical History:  Diagnosis Date  . Diabetes mellitus without complication (HCC)    type II  . Hypertension     Surgical History: Past Surgical History:  Procedure Laterality Date  . ABDOMINAL HYSTERECTOMY      Home Medications:  Allergies as of 12/16/2017      Reactions   Prednisone Other (See Comments)   hyperglycemia      Medication List        Accurate as of 12/16/17  8:54 AM. Always use your most recent med list.          aspirin EC 81 MG tablet Take 1 tablet by mouth daily.   insulin aspart protamine- aspart (70-30) 100 UNIT/ML injection Commonly known as:  NOVOLOG MIX 70/30 Inject 3 Units into the skin 3 x daily with food.   LEVEMIR FLEXTOUCH [place] Inject 15 Units into the skin at bedtime.   losartan 100 MG tablet Commonly known as:  COZAAR Take 1 tablet by mouth daily.   metFORMIN 1000 MG tablet Commonly known as:  GLUCOPHAGE Take 1 tablet by mouth 2 (two) times daily.   metoprolol succinate 100  MG 24 hr tablet Commonly known as:  TOPROL-XL Take 1 tablet by mouth daily.   spironolactone 50 MG tablet Commonly known as:  ALDACTONE Take 1 tablet by mouth daily.       Allergies:  Allergies  Allergen Reactions  . Prednisone Other (See Comments)    hyperglycemia    Family History: Family History  Problem Relation Age of Onset  . Prostate cancer Father   . Cancer Mother     Social History:  reports that  has never smoked. she has never used smokeless tobacco. She reports that she drinks alcohol. She reports that she does not use drugs.  ROS: UROLOGY Frequent Urination?: Yes Hard to postpone urination?: Yes Burning/pain with urination?: No Get up at night to urinate?: Yes Leakage of urine?: Yes Urine stream starts and stops?: No Trouble starting stream?: No Do you have to strain to urinate?: No Blood in urine?: No Urinary tract infection?: No Sexually transmitted disease?: No Injury to kidneys or bladder?: No Painful intercourse?: Yes Weak stream?: No Currently pregnant?: No Vaginal bleeding?: No Last menstrual period?: n  Gastrointestinal Nausea?: No Vomiting?: No Indigestion/heartburn?: No Diarrhea?: No Constipation?: No  Constitutional Fever: No Night sweats?: No Weight loss?: No Fatigue?: No  Skin Skin rash/lesions?: No Itching?: No  Eyes Blurred vision?: No Double vision?: No  Ears/Nose/Throat Sore  throat?: Yes Sinus problems?: No  Hematologic/Lymphatic Swollen glands?: No Easy bruising?: No  Cardiovascular Leg swelling?: Yes Chest pain?: No  Respiratory Cough?: Yes Shortness of breath?: No  Endocrine Excessive thirst?: No  Musculoskeletal Back pain?: Yes Joint pain?: No  Neurological Headaches?: No Dizziness?: No  Psychologic Depression?: No Anxiety?: No  Physical Exam: BP 137/80 (BP Location: Right Arm, Patient Position: Sitting, Cuff Size: Large)   Pulse 73   Ht 5\' 2"  (1.575 m)   Wt 200 lb 4.8 oz (90.9 kg)    BMI 36.64 kg/m   Constitutional:  Alert and oriented, No acute distress. HEENT: [place] AT, moist mucus membranes.  Trachea midline, no masses. Cardiovascular: No clubbing, cyanosis, or edema. Respiratory: Normal respiratory effort, no increased work of breathing. GI: Abdomen is soft, nontender, nondistended, no abdominal masses GU: No CVA tenderness.  Skin: No rashes, bruises or suspicious lesions. Lymph: No cervical or inguinal adenopathy. Neurologic: Grossly intact, no focal deficits, moving all 4 extremities. Psychiatric: Normal mood and affect.  Laboratory Data: Lab Results  Component Value Date   WBC 5.3 09/02/2016   HGB 13.3 09/02/2016   HCT 38.7 09/02/2016   MCV 94.8 09/02/2016   PLT 217 09/02/2016    Lab Results  Component Value Date   CREATININE 1.00 06/23/2017    No results found for: PSA  No results found for: TESTOSTERONE  No results found for: HGBA1C  Urinalysis    Component Value Date/Time   COLORURINE Yellow 11/05/2012 1926   APPEARANCEUR Cloudy (A) 06/08/2017 1025   LABSPEC 1.030 11/05/2012 1926   PHURINE 5.0 11/05/2012 1926   GLUCOSEU Negative 06/08/2017 1025   GLUCOSEU >=500 11/05/2012 1926   HGBUR Negative 11/05/2012 1926   BILIRUBINUR Negative 06/08/2017 1025   BILIRUBINUR Negative 11/05/2012 1926   KETONESUR Trace 11/05/2012 1926   PROTEINUR Negative 06/08/2017 1025   PROTEINUR Negative 11/05/2012 1926   NITRITE Negative 06/08/2017 1025   NITRITE Negative 11/05/2012 1926   LEUKOCYTESUR Negative 06/08/2017 1025   LEUKOCYTESUR Negative 11/05/2012 1926    Assessment & Plan:    1.  Recurrent UTIs Discussed the patient that I cannot diagnose her with her current urinary tract infection without culture results.  I also explained her that I cannot help prevent recurrent urinary tract infections unless I know that she is actually having positive cultures which at this point I do not know.  I have encouraged her that the next time she develops UTI  symptoms to come to our office for a urine culture so that we can start building a database of what her symptoms are and what culture results are.   Return if symptoms worsen or fail to improve.  Hildred LaserBrian James Marcelino Campos, MD  [facility] [address], [address] [place], [place] [id number] [phone]

## 2018-01-20 ENCOUNTER — Ambulatory Visit: Payer: Medicare HMO | Admitting: Urology

## 2018-01-20 ENCOUNTER — Encounter: Payer: Self-pay | Admitting: Urology

## 2018-01-20 VITALS — BP 138/81 | HR 83 | Resp 16 | Ht 62.0 in | Wt 201.8 lb

## 2018-01-20 DIAGNOSIS — N281 Cyst of kidney, acquired: Secondary | ICD-10-CM

## 2018-01-20 DIAGNOSIS — N941 Unspecified dyspareunia: Secondary | ICD-10-CM

## 2018-01-20 DIAGNOSIS — Z87440 Personal history of urinary (tract) infections: Secondary | ICD-10-CM

## 2018-01-20 DIAGNOSIS — N390 Urinary tract infection, site not specified: Secondary | ICD-10-CM | POA: Diagnosis not present

## 2018-01-20 LAB — URINALYSIS, COMPLETE
APPEARANCE UR: NEGATIVE
Bilirubin, UA: NEGATIVE
Color, UA: NEGATIVE
GLUCOSE, UA: NEGATIVE
KETONES UA: NEGATIVE
LEUKOCYTES UA: NEGATIVE
Nitrite, UA: NEGATIVE
PROTEIN UA: NEGATIVE
RBC, UA: NEGATIVE
Specific Gravity, UA: 1.025 (ref 1.005–1.030)
Urobilinogen, Ur: 0.2 mg/dL (ref 0.2–1.0)
pH, UA: 5.5 (ref 5.0–7.5)

## 2018-01-20 NOTE — Progress Notes (Signed)
In and Out Catheterization  Patient is present today for a I & O catheterization due to recurrent UTI. Patient was cleaned and prepped in a sterile fashion with betadine and Lidocaine 2% jelly was instilled into the urethra.  A 14FR cath was inserted no complications were noted , 125ml of urine return was noted, urine was yellow in color. A clean urine sample was collected for UA. Bladder was drained  And catheter was removed with out difficulty.    Preformed by: Keng Jewel, CMA    

## 2018-01-20 NOTE — Progress Notes (Signed)
01/20/2018 2:55 PM   Gina Hudson 08/31/1951 [id number]  Referring provider: Ellamae Sia, MD [address] [place], [place] [address]  Chief Complaint  Patient presents with  . Follow-up    cyst on kidneys     HPI: Patient is a 67 year old African-American female who presents today concerned about her renal cysts and a UTI.  She did have a CT scan in August 2018 that was positive only for bilateral parapelvic cyst.  There is no hydronephrosis or stones.  She states that last weekend she began to have the symptoms of urinary frequency and itching at the top and around her vulva.  This started after intercourse.    She also states that there is a spot in her vaginal vault that hurts during the thrusting with intercourse.  The pain with intercourse has been occurring since August.  She denies any frictional type pain with intercourse.    She also complains of frequency, urgency, nocturia and incontinence as baseline symptoms.    CATH UA is negative.  PVR is 125 mL.     PMH: Past Medical History:  Diagnosis Date  . Diabetes mellitus without complication ([place])    type II  . Hypertension     Surgical History: Past Surgical History:  Procedure Laterality Date  . ABDOMINAL HYSTERECTOMY      Home Medications:  Allergies as of 01/20/2018      Reactions   Prednisone Other (See Comments)   hyperglycemia      Medication List        Accurate as of 01/20/18  2:55 PM. Always use your most recent med list.          aspirin EC 81 MG tablet Take 1 tablet by mouth daily.   insulin aspart protamine- aspart (70-30) 100 UNIT/ML injection Commonly known as:  NOVOLOG MIX 70/30 Inject 3 Units into the skin 3 x daily with food.   LEVEMIR FLEXTOUCH [place] Inject 15 Units into the skin at bedtime.   losartan 100 MG tablet Commonly known as:  COZAAR Take 1 tablet by mouth daily.   metFORMIN 1000 MG tablet Commonly known as:  GLUCOPHAGE Take 1 tablet by mouth 2  (two) times daily.   metoprolol succinate 100 MG 24 hr tablet Commonly known as:  TOPROL-XL Take 1 tablet by mouth daily.   spironolactone 50 MG tablet Commonly known as:  ALDACTONE Take 1 tablet by mouth daily.       Allergies:  Allergies  Allergen Reactions  . Prednisone Other (See Comments)    hyperglycemia    Family History: Family History  Problem Relation Age of Onset  . Prostate cancer Father   . Cancer Mother     Social History:  reports that  has never smoked. she has never used smokeless tobacco. She reports that she drinks alcohol. She reports that she does not use drugs.  ROS: UROLOGY Frequent Urination?: Yes Hard to postpone urination?: Yes Burning/pain with urination?: No Get up at night to urinate?: Yes Leakage of urine?: Yes Urine stream starts and stops?: No Trouble starting stream?: No Do you have to strain to urinate?: No Blood in urine?: No Urinary tract infection?: Yes Sexually transmitted disease?: No Injury to kidneys or bladder?: No Painful intercourse?: Yes Weak stream?: No Currently pregnant?: No Vaginal bleeding?: No Last menstrual period?: n  Gastrointestinal Nausea?: No Vomiting?: No Indigestion/heartburn?: No Diarrhea?: Yes Constipation?: No  Constitutional Fever: No Night sweats?: No Weight loss?: No Fatigue?: Yes  Skin Skin rash/lesions?: No Itching?: Yes  Eyes Blurred vision?: No Double vision?: No  Ears/Nose/Throat Sore throat?: Yes Sinus problems?: No  Hematologic/Lymphatic Swollen glands?: No Easy bruising?: No  Cardiovascular Leg swelling?: Yes Chest pain?: No  Respiratory Cough?: Yes Shortness of breath?: No  Endocrine Excessive thirst?: No  Musculoskeletal Back pain?: Yes Joint pain?: No  Neurological Headaches?: Yes Dizziness?: No  Psychologic Depression?: No Anxiety?: Yes  Physical Exam: BP 138/81   Pulse 83   Resp 16   Ht _0  (1.575 m)   Wt 201 lb 12.8 oz (91.5 kg)    SpO2 99%   BMI 36.91 kg/m   Constitutional:  Alert and oriented, No acute distress. HEENT: [place] AT, moist mucus membranes.  Trachea midline, no masses. Cardiovascular: No clubbing, cyanosis, or edema. Respiratory: Normal respiratory effort, no increased work of breathing. GI: Abdomen is soft, nontender, nondistended, no abdominal masses GU: No CVA tenderness. Normal external genitalia, normal pubic hair distribution, no lesions.  Normal urethral meatus, no lesions, no prolapse, no discharge.   No urethral masses, tenderness and/or tenderness. No bladder fullness, tenderness or masses. Normal vagina mucosa, good estrogen effect, no discharge, no lesions, good pelvic support, no cystocele or rectocele noted.  Uterus is surgically absent.  No adnexal/parametria masses or tenderness noted.  Anus and perineum are without rashes or lesions.    Skin: No rashes, bruises or suspicious lesions. Lymph: No cervical or inguinal adenopathy. Neurologic: Grossly intact, no focal deficits, moving all 4 extremities. Psychiatric: Normal mood and affect.  Laboratory Data: Lab Results  Component Value Date   WBC 5.3 09/02/2016   HGB 13.3 09/02/2016   HCT 38.7 09/02/2016   MCV 94.8 09/02/2016   PLT 217 09/02/2016    Lab Results  Component Value Date   CREATININE 1.00 06/23/2017    No results found for: PSA  No results found for: TESTOSTERONE  No results found for: HGBA1C  Urinalysis    Component Value Date/Time   COLORURINE Yellow 11/05/2012 1926   APPEARANCEUR Cloudy (A) 12/16/2017 0831   LABSPEC 1.030 11/05/2012 1926   PHURINE 5.0 11/05/2012 1926   GLUCOSEU Negative 12/16/2017 0831   GLUCOSEU >=500 11/05/2012 1926   HGBUR Negative 11/05/2012 1926   BILIRUBINUR Negative 12/16/2017 0831   BILIRUBINUR Negative 11/05/2012 1926   KETONESUR Trace 11/05/2012 1926   PROTEINUR Negative 12/16/2017 0831   PROTEINUR Negative 11/05/2012 1926   NITRITE Negative 12/16/2017 0831   NITRITE Negative  11/05/2012 1926   LEUKOCYTESUR Negative 12/16/2017 0831   LEUKOCYTESUR Negative 11/05/2012 1926    Assessment & Plan:    1.  History of presumptive recurrent UTIs  - criteria for recurrent UTI has been not met with 2 or more infections in 6 months or 3 or greater infections in one year   - Patient is instructed to increase their water intake until the urine is pale yellow or clear (10 to 12 cups daily)   - encouraged the patient to take probiotics (yogurt, oral pills or vaginal suppositories), take cranberry pills or drink the juice and Vitamin C 1,000 mg daily to acidify the urine should be added to their daily regimen   - avoid soaking in tubs and wipe front to back after urinating   - advised them to have CATH UA's for urinalysis and culture to prevent skin contamination of the specimen  - reviewed symptoms of UTI and advised not to have urine checked or be treated for UTI if not experiencing symptoms  - discussed  antibiotic stewardship with the patient    2. Renal cysts -Explained to the patient that these are benign and no further follow-up is indicated  3.  Dyspareunia -refer to gynecology for further evaluation                                        Return if symptoms worsen or fail to improve.  Zara Council, [facility] [address], [place], [place] [address] [phone]

## 2018-01-20 NOTE — Patient Instructions (Signed)

## 2018-01-24 LAB — CULTURE, URINE COMPREHENSIVE

## 2018-02-07 ENCOUNTER — Encounter: Payer: Self-pay | Admitting: Obstetrics & Gynecology

## 2018-03-01 ENCOUNTER — Other Ambulatory Visit: Payer: Self-pay

## 2018-03-22 ENCOUNTER — Other Ambulatory Visit: Payer: Self-pay | Admitting: Internal Medicine

## 2018-03-22 DIAGNOSIS — N951 Menopausal and female climacteric states: Secondary | ICD-10-CM

## 2018-03-22 DIAGNOSIS — Z1239 Encounter for other screening for malignant neoplasm of breast: Secondary | ICD-10-CM

## 2018-03-24 ENCOUNTER — Encounter: Payer: Self-pay | Admitting: Obstetrics & Gynecology

## 2018-04-04 ENCOUNTER — Encounter: Payer: Self-pay | Admitting: Obstetrics & Gynecology

## 2018-04-19 ENCOUNTER — Other Ambulatory Visit: Payer: Medicare HMO

## 2018-05-23 ENCOUNTER — Encounter: Payer: Self-pay | Admitting: Obstetrics and Gynecology

## 2019-08-02 ENCOUNTER — Emergency Department: Payer: Medicare HMO

## 2019-08-02 ENCOUNTER — Encounter: Payer: Self-pay | Admitting: Emergency Medicine

## 2019-08-02 ENCOUNTER — Other Ambulatory Visit: Payer: Self-pay

## 2019-08-02 ENCOUNTER — Emergency Department
Admission: EM | Admit: 2019-08-02 | Discharge: 2019-08-02 | Disposition: A | Discharge status: Home / Self Care | Payer: Medicare HMO | Attending: Emergency Medicine | Admitting: Emergency Medicine

## 2019-08-02 DIAGNOSIS — R079 Chest pain, unspecified: Secondary | ICD-10-CM | POA: Diagnosis present

## 2019-08-02 DIAGNOSIS — E119 Type 2 diabetes mellitus without complications: Secondary | ICD-10-CM | POA: Diagnosis not present

## 2019-08-02 DIAGNOSIS — Z794 Long term (current) use of insulin: Secondary | ICD-10-CM | POA: Insufficient documentation

## 2019-08-02 DIAGNOSIS — Z7982 Long term (current) use of aspirin: Secondary | ICD-10-CM | POA: Diagnosis not present

## 2019-08-02 DIAGNOSIS — R0789 Other chest pain: Secondary | ICD-10-CM

## 2019-08-02 DIAGNOSIS — Z79899 Other long term (current) drug therapy: Secondary | ICD-10-CM | POA: Insufficient documentation

## 2019-08-02 DIAGNOSIS — I10 Essential (primary) hypertension: Secondary | ICD-10-CM | POA: Insufficient documentation

## 2019-08-02 HISTORY — DX: Left bundle-branch block, unspecified: I44.7

## 2019-08-02 LAB — CBC
HCT: 34.5 % — ABNORMAL LOW (ref 36.0–46.0)
Hemoglobin: 11.4 g/dL — ABNORMAL LOW (ref 12.0–15.0)
MCH: 32.2 pg (ref 26.0–34.0)
MCHC: 33 g/dL (ref 30.0–36.0)
MCV: 97.5 fL (ref 80.0–100.0)
Platelets: 219 10*3/uL (ref 150–400)
RBC: 3.54 MIL/uL — ABNORMAL LOW (ref 3.87–5.11)
RDW: 12.3 % (ref 11.5–15.5)
WBC: 4.8 10*3/uL (ref 4.0–10.5)
nRBC: 0 % (ref 0.0–0.2)

## 2019-08-02 LAB — BASIC METABOLIC PANEL
Anion gap: 9 (ref 5–15)
BUN: 28 mg/dL — ABNORMAL HIGH (ref 8–23)
CO2: 20 mmol/L — ABNORMAL LOW (ref 22–32)
Calcium: 9.1 mg/dL (ref 8.9–10.3)
Chloride: 110 mmol/L (ref 98–111)
Creatinine, Ser: 1.16 mg/dL — ABNORMAL HIGH (ref 0.44–1.00)
GFR calc Af Amer: 56 mL/min — ABNORMAL LOW (ref >60)
GFR calc non Af Amer: 49 mL/min — ABNORMAL LOW (ref >60)
Glucose, Bld: 221 mg/dL — ABNORMAL HIGH (ref 70–99)
Potassium: 4.3 mmol/L (ref 3.5–5.1)
Sodium: 139 mmol/L (ref 135–145)

## 2019-08-02 LAB — TROPONIN I (HIGH SENSITIVITY)
Troponin I (High Sensitivity): 6 ng/L (ref <18)
Troponin I (High Sensitivity): 6 ng/L (ref <18)

## 2019-08-02 NOTE — ED Triage Notes (Signed)
Here for left sided chest pain that radiates in left shoulder/back.  Pain described as sore. No SHOB, nausea or diaphoresis. No cough/fever. Unlabored. VSS.  Pain intermittent since Sunday.

## 2019-08-02 NOTE — ED Notes (Signed)
Pt states "I'm really not that sick, I just wanna know why my back hurts"

## 2019-08-02 NOTE — ED Provider Notes (Signed)
[facility] Emergency Department Provider Note  ____________________________________________  Time seen: Approximately 6:44 PM  I have reviewed the triage vital signs and the nursing notes.   HISTORY  Chief Complaint Chest Pain    HPI Gina Hudson is a 68 y.o. female with a history of hypertension diabetes who comes the ED complaining of left chest pain radiating to the back, described as sharp and sore, intermittent, worse with movement and change in position.  Not exertional, not pleuritic.  No associated shortness of breath vomiting diaphoresis dizziness or syncope.  Denies any falls or strenuous activity or heavy lifting.  Has had pain like this in the past.  Review of electronic medical record shows prior ED visits at other hospitals for similar symptoms.  She has had negative stress test in the past.  She does note that she is been under increased stress recently due to the Labor Day weekend and the recent death of a family member.   Denies any extremity paresthesia or weakness   Past Medical History:  Diagnosis Date  . Diabetes mellitus without complication ([place])    type II  . Hypertension   . Left bundle branch block      There are no active problems to display for this patient.    Past Surgical History:  Procedure Laterality Date  . ABDOMINAL HYSTERECTOMY       Prior to Admission medications   Medication Sig Start Date End Date Taking? Authorizing Provider  aspirin EC 81 MG tablet Take 1 tablet by mouth daily.    [provider]  insulin aspart protamine- aspart (NOVOLOG MIX 70/30) (70-30) 100 UNIT/ML injection Inject 3 Units into the skin 3 x daily with food.    [provider]  Insulin Detemir (LEVEMIR FLEXTOUCH [place]) Inject 15 Units into the skin at bedtime.    [provider]  losartan (COZAAR) 100 MG tablet Take 1 tablet by mouth daily.    [provider]  metFORMIN (GLUCOPHAGE) 1000 MG tablet Take 1  tablet by mouth 2 (two) times daily.    [provider]  metoprolol succinate (TOPROL-XL) 100 MG 24 hr tablet Take 1 tablet by mouth daily.    [provider]  spironolactone (ALDACTONE) 50 MG tablet Take 1 tablet by mouth daily. 05/13/17   [provider]     Allergies Prednisone   Family History  Problem Relation Age of Onset  . Prostate cancer Father   . Cancer Mother     Social History Social History   Tobacco Use  . Smoking status: Never Smoker  . Smokeless tobacco: Never Used  Substance Use Topics  . Alcohol use: Yes    Comment: holidays  . Drug use: No    Review of Systems  Constitutional:   No fever or chills.  ENT:   No sore throat. No rhinorrhea. Cardiovascular:   Positive as above chest pain without syncope. Respiratory:   No dyspnea or cough. Gastrointestinal:   Negative for abdominal pain, vomiting and diarrhea.  Musculoskeletal:   Negative for focal pain or swelling All other systems reviewed and are negative except as documented above in ROS and HPI.  ____________________________________________   PHYSICAL EXAM:  VITAL SIGNS: ED Triage Vitals [08/02/19 1534]  Enc Vitals Group     BP 132/71     Pulse Rate 74     Resp 18     Temp 98.8 F (37.1 C)     Temp Source Oral  SpO2 98 %     Weight 198 lb (89.8 kg)     Height 5\' 1"  (1.549 m)     Head Circumference      Peak Flow      Pain Score 8     Pain Loc      Pain Edu?      Excl. in GC?     Vital signs reviewed, nursing assessments reviewed.   Constitutional:   Alert and oriented. Non-toxic appearance. Eyes:   Conjunctivae are normal. EOMI. PERRL. ENT      Head:   Normocephalic and atraumatic.          Neck:   No meningismus. Full ROM. Hematological/Lymphatic/Immunilogical:   No cervical lymphadenopathy. Cardiovascular:   RRR. Symmetric bilateral radial and DP pulses.  No murmurs. Cap refill less than 2 seconds. Respiratory:   Normal respiratory effort  without tachypnea/retractions. Breath sounds are clear and equal bilaterally. No wheezes/rales/rhonchi. Gastrointestinal:   Soft and nontender. Non distended. There is no CVA tenderness.  No rebound, rigidity, or guarding.  Musculoskeletal:   Normal range of motion in all extremities. No joint effusions.  No lower extremity tenderness.  No edema.  Chest wall tender to the touch reproducing her pain as demonstrated by the patient on the anterior chest.  She has tenderness in the posterior chest at the right rhomboids.  These reproduce her symptoms. Neurologic:   Normal speech and language.  Motor grossly intact. No acute focal neurologic deficits are appreciated.  Skin:    Skin is warm, dry and intact. No rash noted.  No petechiae, purpura, or bullae.  ____________________________________________    LABS (pertinent positives/negatives) (all labs ordered are listed, but only abnormal results are displayed) Labs Reviewed  BASIC METABOLIC PANEL - Abnormal; Notable for the following components:      Result Value   CO2 20 (*)    Glucose, Bld 221 (*)    BUN 28 (*)    Creatinine, Ser 1.16 (*)    GFR calc non Af Amer 49 (*)    GFR calc Af Amer 56 (*)    All other components within normal limits  CBC - Abnormal; Notable for the following components:   RBC 3.54 (*)    Hemoglobin 11.4 (*)    HCT 34.5 (*)    All other components within normal limits  TROPONIN I (HIGH SENSITIVITY)  TROPONIN I (HIGH SENSITIVITY)   ____________________________________________   EKG  Interpreted by me Normal sinus rhythm rate of 72, normal axis and intervals.  Left bundle branch block.  No acute ischemic changes.  ____________________________________________    RADIOLOGY  Dg Chest 2 View  Result Date: 08/02/2019 CLINICAL DATA:  Chest pain EXAM: CHEST - 2 VIEW COMPARISON:  09/02/2016 FINDINGS: The heart size and mediastinal contours are within normal limits. Both lungs are clear. The visualized skeletal  structures are unremarkable. IMPRESSION: No active cardiopulmonary disease. Electronically Signed   By: Jasmine Pang M.D.   On: 08/02/2019 16:09    ____________________________________________   PROCEDURES Procedures  ____________________________________________    CLINICAL IMPRESSION / ASSESSMENT AND PLAN / ED COURSE  Medications ordered in the ED: Medications - No data to display  Pertinent labs & imaging results that were available during my care of the patient were reviewed by me and considered in my medical decision making (see chart for details).  Gina Hudson was evaluated in Emergency Department on 08/02/2019 for the symptoms described in the history of present illness. She was evaluated  in the context of the global COVID-19 pandemic, which necessitated consideration that the patient might be at risk for infection with the SARS-CoV-2 virus that causes COVID-19. Institutional protocols and algorithms that pertain to the evaluation of patients at risk for COVID-19 are in a state of rapid change based on information released by regulatory bodies including the CDC and federal and state organizations. These policies and algorithms were followed during the patient's care in the ED.   Patient presents with atypical chest pain, consistent with chest wall strain.  EKG chest x-ray and labs are all unremarkable.  Due to her comorbidities and CAD risk factors, I will get a second troponin for further stratification, but she is low risk, p/w noncardiac symptoms, and stable for outpatient follow-up      ____________________________________________   FINAL CLINICAL IMPRESSION(S) / ED DIAGNOSES    Final diagnoses:  Chest wall pain     ED Discharge Orders    None      Portions of this note were generated with dragon dictation software. Dictation errors may occur despite best attempts at proofreading.   Sharman CheekStafford, Aisea Bouldin, MD 08/02/19 [phone]

## 2019-08-02 NOTE — ED Notes (Signed)
Signature pad in room not working, pt has no questions regarding discharge.

## 2020-01-04 ENCOUNTER — Encounter: Payer: Self-pay | Admitting: Urology

## 2020-01-04 ENCOUNTER — Other Ambulatory Visit: Payer: Self-pay

## 2020-01-04 ENCOUNTER — Ambulatory Visit: Payer: Medicare HMO | Admitting: Urology

## 2020-01-04 VITALS — BP 137/77 | HR 76 | Ht 61.0 in | Wt 198.9 lb

## 2020-01-04 DIAGNOSIS — Z87440 Personal history of urinary (tract) infections: Secondary | ICD-10-CM

## 2020-01-04 DIAGNOSIS — N3000 Acute cystitis without hematuria: Secondary | ICD-10-CM

## 2020-01-04 DIAGNOSIS — E119 Type 2 diabetes mellitus without complications: Secondary | ICD-10-CM

## 2020-01-04 LAB — BLADDER SCAN AMB NON-IMAGING: Scan Result: 13

## 2020-01-04 MED ORDER — SULFAMETHOXAZOLE-TRIMETHOPRIM 800-160 MG PO TABS: 1.0000 | ORAL_TABLET | Freq: Two times a day (BID) | ORAL | 0 refills | Status: DC

## 2020-01-04 NOTE — Progress Notes (Signed)
01/04/2020 9:19 AM   Gina Hudson 12-08-1950 [id number]  Referring provider: Hyman Hopes, MD [address] [place],  [place] [address]  Chief Complaint  Patient presents with  . Recurrent UTI    HPI: Gina Hudson is a 69 year old female with a history or rUTI's who presents today for symptoms of frequency and low back pain for three weeks.   She states that she has been having increasing urinary frequency and lower back pain for 3 weeks.  She is a diabetic and is also very concerned about her kidney function at this time as well.  Patient denies any modifying or aggravating factors.  Patient denies any gross hematuria, dysuria or suprapubic/flank pain.  Patient denies any fevers, chills, nausea or vomiting.   UA yellow cloudy, trace glucose, specific gravity 1.020, pH 5.5, nitrate positive, leukocyte 1+, 11-30 WBCs per high-power field, 0-10 epithelial cells per high-power field and moderate bacteria.  PVR is 13 mL.    She denies taking any over the counter bladder medication or Pyridium.  CT scan in August 2018 that was positive only for bilateral parapelvic cyst.  There is no hydronephrosis or stones.   PMH: Past Medical History:  Diagnosis Date  . Diabetes mellitus without complication (HCC)    type II  . Hypertension   . Left bundle branch block     Surgical History: Past Surgical History:  Procedure Laterality Date  . ABDOMINAL HYSTERECTOMY      Home Medications:  Allergies as of 01/04/2020      Reactions   Prednisone Other (See Comments)   hyperglycemia      Medication List       Accurate as of January 04, 2020 11:59 PM. If you have any questions, ask your nurse or doctor.        aspirin EC 81 MG tablet Take 1 tablet by mouth daily.   insulin aspart protamine- aspart (70-30) 100 UNIT/ML injection Commonly known as: NOVOLOG MIX 70/30 Inject 3 Units into the skin 3 x daily with food.   LEVEMIR FLEXTOUCH [place] Inject 15 Units into the  skin at bedtime.   losartan 100 MG tablet Commonly known as: COZAAR Take 1 tablet by mouth daily.   metFORMIN 1000 MG tablet Commonly known as: GLUCOPHAGE Take 1 tablet by mouth 2 (two) times daily.   metoprolol succinate 100 MG 24 hr tablet Commonly known as: TOPROL-XL Take 1 tablet by mouth daily.   spironolactone 50 MG tablet Commonly known as: ALDACTONE Take 1 tablet by mouth daily.   sulfamethoxazole-trimethoprim 800-160 MG tablet Commonly known as: BACTRIM DS Take 1 tablet by mouth every 12 (twelve) hours. Started by: Michiel Cowboy, PA-C   TRUEplus Pen Needles 31G X 5 MM Misc Generic drug: Insulin Pen Needle   Trulicity 0.75 MG/0.5ML Sopn Generic drug: Dulaglutide       Allergies:  Allergies  Allergen Reactions  . Prednisone Other (See Comments)    hyperglycemia    Family History: Family History  Problem Relation Age of Onset  . Prostate cancer Father   . Cancer Mother     Social History:  reports that she has never smoked. She has never used smokeless tobacco. She reports current alcohol use. She reports that she does not use drugs.  ROS: Pertinent ROS in HPI  Physical Exam: BP 137/77   Pulse 76   Ht 5\' 1"  (1.549 m)   Wt 198 lb 14.4 oz (90.2 kg)   BMI 37.58 kg/m  Constitutional:  Well nourished. Alert and oriented, No acute distress. HEENT: [place] AT, mask in place.  Trachea midline, no masses. Cardiovascular: No clubbing, cyanosis, or edema. Respiratory: Normal respiratory effort, no increased work of breathing. Neurologic: Grossly intact, no focal deficits, moving all 4 extremities. Psychiatric: Normal mood and affect.     Laboratory Data: Lab Results  Component Value Date   WBC 4.8 08/02/2019   HGB 11.4 (L) 08/02/2019   HCT 34.5 (L) 08/02/2019   MCV 97.5 08/02/2019   PLT 219 08/02/2019    Lab Results  Component Value Date   CREATININE 1.12 (H) 01/04/2020    Urinalysis Component     Latest Ref Rng & Units 01/04/2020  Specific  Gravity, UA     1.005 - 1.030 1.020  pH, UA     5.0 - 7.5 5.5  Color, UA     Yellow Yellow  Appearance Ur     Clear Cloudy (A)  Leukocytes,UA     Negative 1+ (A)  Protein,UA     Negative/Trace Negative  Glucose, UA     Negative Trace (A)  Ketones, UA     Negative Negative  RBC, UA     Negative Negative  Bilirubin, UA     Negative Negative  Urobilinogen, Ur     0.2 - 1.0 mg/dL 0.2  Nitrite, UA     Negative Positive (A)  Microscopic Examination      See below:   Component     Latest Ref Rng & Units 01/04/2020  WBC, UA     0 - 5 /hpf 11-30 (A)  RBC     0 - 2 /hpf None seen  Epithelial Cells (non renal)     0 - 10 /hpf 0-10  Bacteria, UA     None seen/Few Moderate (A)   I have reviewed the labs.  Assessment & Plan:    1. UTI UA is nitrite positive Urine is sent for culture and she is started on Septra DS twice daily x7 days and will adjust if necessary once culture and sensitivities are available She will return in 3 weeks for symptom recheck  2. Diabetes Patient is concerned about renal function, so I will obtain a BMP for her today                                       Return in about 3 weeks (around 01/25/2020) for recheck .  Zara Council, [facility] [address], [place], [place] [address] [phone]

## 2020-01-05 ENCOUNTER — Telehealth: Payer: Self-pay | Admitting: Family Medicine

## 2020-01-05 LAB — BASIC METABOLIC PANEL
BUN/Creatinine Ratio: 18 (ref 12–28)
BUN: 20 mg/dL (ref 8–27)
CO2: 18 mmol/L — ABNORMAL LOW (ref 20–29)
Calcium: 10 mg/dL (ref 8.7–10.3)
Chloride: 108 mmol/L — ABNORMAL HIGH (ref 96–106)
Creatinine, Ser: 1.12 mg/dL — ABNORMAL HIGH (ref 0.57–1.00)
GFR calc Af Amer: 58 mL/min/{1.73_m2} — ABNORMAL LOW (ref >59)
GFR calc non Af Amer: 51 mL/min/{1.73_m2} — ABNORMAL LOW (ref >59)
Glucose: 151 mg/dL — ABNORMAL HIGH (ref 65–99)
Potassium: 5.6 mmol/L — ABNORMAL HIGH (ref 3.5–5.2)
Sodium: 142 mmol/L (ref 134–144)

## 2020-01-05 LAB — URINALYSIS, COMPLETE
Bilirubin, UA: NEGATIVE
Ketones, UA: NEGATIVE
Nitrite, UA: POSITIVE — AB
Protein,UA: NEGATIVE
RBC, UA: NEGATIVE
Specific Gravity, UA: 1.02 (ref 1.005–1.030)
Urobilinogen, Ur: 0.2 mg/dL (ref 0.2–1.0)
pH, UA: 5.5 (ref 5.0–7.5)

## 2020-01-05 LAB — MICROSCOPIC EXAMINATION: RBC, Urine: NONE SEEN /hpf (ref 0–2)

## 2020-01-05 NOTE — Telephone Encounter (Signed)
Patient notified and and voiced understanding. She states she is still seeing Dr. Lawerance Bach.

## 2020-01-05 NOTE — Telephone Encounter (Signed)
-----   Message from Harle Battiest, PA-C sent at 01/05/2020  7:57 AM EST ----- Please let Gina Hudson know that her kidney function has declined a little over the last two years.  We will order a RUS to make sure there is no stones, hydronephrosis or other surgically modifiable findings.  We will forward these labs to her PCP.  Is she still seeing Dr. Lawerance Bach?

## 2020-01-07 LAB — CULTURE, URINE COMPREHENSIVE

## 2020-01-08 ENCOUNTER — Telehealth: Payer: Self-pay | Admitting: Family Medicine

## 2020-01-08 NOTE — Telephone Encounter (Signed)
-----   Message from Harle Battiest, PA-C sent at 01/08/2020  8:13 AM EST ----- Please let Mrs. Ahner know that her urine culture was positive for infection and that the Septra is the correct antibiotic.  We will see her on the 4th of March.

## 2020-01-08 NOTE — Telephone Encounter (Signed)
Patient notified and voiced understanding.

## 2020-01-25 ENCOUNTER — Ambulatory Visit: Payer: Medicare HMO | Admitting: Urology

## 2020-01-25 ENCOUNTER — Encounter: Payer: Self-pay | Admitting: Urology

## 2020-01-25 ENCOUNTER — Other Ambulatory Visit: Payer: Self-pay

## 2020-01-25 VITALS — BP 132/81 | HR 98 | Ht 61.0 in | Wt 198.0 lb

## 2020-01-25 DIAGNOSIS — B373 Candidiasis of vulva and vagina: Secondary | ICD-10-CM

## 2020-01-25 DIAGNOSIS — B3731 Acute candidiasis of vulva and vagina: Secondary | ICD-10-CM

## 2020-01-25 DIAGNOSIS — N390 Urinary tract infection, site not specified: Secondary | ICD-10-CM | POA: Diagnosis not present

## 2020-01-25 DIAGNOSIS — N952 Postmenopausal atrophic vaginitis: Secondary | ICD-10-CM

## 2020-01-25 LAB — URINALYSIS, COMPLETE
Bilirubin, UA: NEGATIVE
Leukocytes,UA: NEGATIVE
Nitrite, UA: POSITIVE — AB
Protein,UA: NEGATIVE
RBC, UA: NEGATIVE
Specific Gravity, UA: 1.025 (ref 1.005–1.030)
Urobilinogen, Ur: 0.2 mg/dL (ref 0.2–1.0)
pH, UA: 5.5 (ref 5.0–7.5)

## 2020-01-25 LAB — MICROSCOPIC EXAMINATION: RBC, Urine: NONE SEEN /hpf (ref 0–2)

## 2020-01-25 MED ORDER — MONISTAT 7 COMPLETE THERAPY 100-2 MG-% VA KIT: 2.0000 mg | PACK | Freq: Every day | VAGINAL | 0 refills | Status: DC

## 2020-01-25 MED ORDER — NITROFURANTOIN MONOHYD MACRO 100 MG PO CAPS: 100.0000 mg | ORAL_CAPSULE | Freq: Two times a day (BID) | ORAL | 0 refills | Status: DC

## 2020-01-25 MED ORDER — PREMARIN 0.625 MG/GM VA CREA: TOPICAL_CREAM | VAGINAL | 12 refills | Status: DC

## 2020-01-25 MED ORDER — FLUCONAZOLE 150 MG PO TABS: 150.0000 mg | ORAL_TABLET | Freq: Once | ORAL | 0 refills | Status: AC

## 2020-01-25 NOTE — Progress Notes (Signed)
01/25/2020 3:26 PM   Gina Hudson 10/19/1951 [id number]  Referring provider: Ellamae Sia, MD [address] [place],  [place] [address]  Chief Complaint  Patient presents with  . Follow-up    HPI: Gina Hudson is a 69 year old female with rUTI's who presents today for a recheck with her daughter on speaker phone, Gina Hudson.    At her visit on 01/04/2020, she was found to have a positive urine culture for a pan-sensitive E.coli.  She has completed her culture appropriate antibiotic.    Today, she is experiencing intermittency with dysuria.  Patient denies any modifying or aggravating factors.  Patient denies any gross hematuria, dysuria or suprapubic/flank pain.  Patient denies any fevers, chills, nausea or vomiting.   UA is yellow cloudy, 2+ glucose, trace ketone, pH 5.5, positive nitrite, 11-30 WBCs, 0-10 epithelial cells, hyaline casts present and many bacteria.  Basic metabolic panel from January 04, 2020 noted serum creatinine of 1.12 and a glomerular filtration rate of 58 which is a slight improvement over her previous lab work in September 2020.   PMH: Past Medical History:  Diagnosis Date  . Diabetes mellitus without complication ([place])    type II  . Hypertension   . Left bundle branch block     Surgical History: Past Surgical History:  Procedure Laterality Date  . ABDOMINAL HYSTERECTOMY      Home Medications:  Allergies as of 01/25/2020      Reactions   Prednisone Other (See Comments)   hyperglycemia      Medication List       Accurate as of January 25, 2020  3:26 PM. If you have any questions, ask your nurse or doctor.        STOP taking these medications   losartan 100 MG tablet Commonly known as: COZAAR Stopped by: Stevey Stapleton, PA-C   sulfamethoxazole-trimethoprim 800-160 MG tablet Commonly known as: BACTRIM DS Stopped by: Katheren Jimmerson, PA-C     TAKE these medications   aspirin EC 81 MG tablet Take 1 tablet by mouth  daily.   fluconazole 150 MG tablet Commonly known as: DIFLUCAN Take 1 tablet (150 mg total) by mouth once for 1 dose. Started by: Zara Council, PA-C   insulin aspart protamine- aspart (70-30) 100 UNIT/ML injection Commonly known as: NOVOLOG MIX 70/30 Inject 3 Units into the skin 3 x daily with food.   LEVEMIR FLEXTOUCH [place] Inject 15 Units into the skin at bedtime.   metFORMIN 1000 MG tablet Commonly known as: GLUCOPHAGE Take 1 tablet by mouth 2 (two) times daily.   metoprolol succinate 100 MG 24 hr tablet Commonly known as: TOPROL-XL Take 1 tablet by mouth daily.   Monistat 7 Complete Therapy 100-2 MG-% Kit Place 2 mg vaginally daily. Started by: Zara Council, PA-C   spironolactone 50 MG tablet Commonly known as: ALDACTONE Take 1 tablet by mouth daily.   TRUEplus Pen Needles 31G X 5 MM Misc Generic drug: Insulin Pen Needle   Trulicity 5.45 GY/5.6LS Sopn Generic drug: Dulaglutide       Allergies:  Allergies  Allergen Reactions  . Prednisone Other (See Comments)    hyperglycemia    Family History: Family History  Problem Relation Age of Onset  . Prostate cancer Father   . Cancer Mother     Social History:  reports that she has never smoked. She has never used smokeless tobacco. She reports current alcohol use. She reports that she does not use drugs.  ROS: For pertinent review of systems please refer to history of present illness  Physical Exam: BP 132/81   Pulse 98   Ht '5\' 1"'$  (1.549 m)   Wt 198 lb (89.8 kg)   BMI 37.41 kg/m   Constitutional:  Well nourished. Alert and oriented, No acute distress. HEENT:  AT, mask in place.  Trachea midline, no masses. Cardiovascular: No clubbing, cyanosis, or edema. Respiratory: Normal respiratory effort, no increased work of breathing. GI: Abdomen is soft, non tender, non distended, no abdominal masses.  GU: No CVA tenderness.  No bladder fullness or masses.  Atrophic external genitalia, sparse pubic hair  distribution, no lesions.  Normal urethral meatus, no lesions, no prolapse, whitish, cottage cheese  discharge.   No urethral masses, tenderness and/or tenderness. No bladder fullness, tenderness or masses. Pale vagina mucosa, poor estrogen effect, no discharge, no lesions, fair pelvic support, grade I cystocele and no rectocele noted.  Cervix and uterus are surgically absent.   No adnexal/parametria masses or tenderness noted.  Anus and perineum are without rashes or lesions.    Skin: No rashes, bruises or suspicious lesions. Lymph: No inguinal adenopathy. Neurologic: Grossly intact, no focal deficits, moving all 4 extremities. Psychiatric: Normal mood and affect.    Laboratory Data: Lab Results  Component Value Date   WBC 4.8 08/02/2019   HGB 11.4 (L) 08/02/2019   HCT 34.5 (L) 08/02/2019   MCV 97.5 08/02/2019   PLT 219 08/02/2019    Lab Results  Component Value Date   CREATININE 1.12 (H) 01/04/2020    Urinalysis Component     Latest Ref Rng & Units 01/25/2020  Specific Gravity, UA     1.005 - 1.030 1.025  pH, UA     5.0 - 7.5 5.5  Color, UA     Yellow Yellow  Appearance Ur     Clear Cloudy (A)  Leukocytes,UA     Negative Negative  Protein,UA     Negative/Trace Negative  Glucose, UA     Negative 2+ (A)  Ketones, UA     Negative Trace (A)  RBC, UA     Negative Negative  Bilirubin, UA     Negative Negative  Urobilinogen, Ur     0.2 - 1.0 mg/dL 0.2  Nitrite, UA     Negative Positive (A)  Microscopic Examination      See below:   Component     Latest Ref Rng & Units 01/25/2020  WBC, UA     0 - 5 /hpf 11-30 (A)  RBC     0 - 2 /hpf None seen  Epithelial Cells (non renal)     0 - 10 /hpf 0-10  Casts     None seen /lpf Present (A)  Cast Type     N/A Hyaline casts  Bacteria, UA     None seen/Few Many (A)    I have reviewed the labs.  Assessment & Plan:    1. rUTI's Criteria for recurrent UTI has been met with 2 or more infections in 6 months or 3 or  greater infections in one year  Patient is instructed to increase their water intake until the urine is pale yellow or clear (10 to 12 cups daily)  Patient is instructed to take probiotics (yogurt, oral pills or vaginal suppositories), take cranberry pills and Vitamin C 1,000 mg daily to acidify the urine  Avoid soaking in tubs and wipe front to back after urinating  UA grossly infected - sent for  culture -started Macrobid-we will adjust if necessary once urine culture results and sensitivities have returned Obtain ultrasound of kidneys to rule out nidus for infection To return to clinic pending the results of RUS   2. Vaginal atrophy I explained to the patient that when women go through menopause and her estrogen levels are severely diminished, the normal vaginal flora will change.  This is due to an increase of the vaginal canal's pH. Because of this, the vaginal canal may be colonized by bacteria from the rectum instead of the protective lactobacillus.  This, accompanied by the loss of the mucus barrier with vaginal atrophy, is a cause of recurrent urinary tract infections. In some studies, the use of vaginal estrogen cream has been demonstrated to reduce  recurrent urinary tract infections to one a year.  Patient was given a prescription for vaginal estrogen cream (Premarin) and instructed to apply 0.'5mg'$  (pea-sized amount)  just inside the vaginal introitus with a finger-tip on Monday, Wednesday and Friday nights.  I explained to the patient that vaginally administered estrogen, which causes only a slight increase in the blood estrogen levels, have fewer contraindications and adverse systemic effects that oral HT. She will follow up in eight weeks for an exam.    3. Yeast candidiasis Given prescription for Diflucan and Monstat cream                                    Return for pending urine culture .  Zara Council, [facility] [address], [place], [place] [address] [phone]

## 2020-01-29 ENCOUNTER — Telehealth: Payer: Self-pay

## 2020-01-29 LAB — CULTURE, URINE COMPREHENSIVE

## 2020-01-29 NOTE — Telephone Encounter (Signed)
Left pt a message to call, no DPR on file

## 2020-01-29 NOTE — Telephone Encounter (Signed)
-----   Message from Harle Battiest, PA-C sent at 01/29/2020  8:19 AM EST ----- Please let Mrs. Auxier know that her urine culture returned positive for infection and the Macrobid is the correct antibiotic.

## 2020-01-30 NOTE — Telephone Encounter (Signed)
Patient notified

## 2020-04-30 ENCOUNTER — Ambulatory Visit: Payer: Medicare HMO | Admitting: *Deleted

## 2020-05-09 ENCOUNTER — Ambulatory Visit: Payer: Medicare HMO | Admitting: *Deleted

## 2021-02-05 ENCOUNTER — Ambulatory Visit: Payer: Medicare HMO | Admitting: Internal Medicine

## 2021-02-25 NOTE — Progress Notes (Signed)
New Outpatient Visit Date: 02/26/2021  Referring Provider: Armando Gang, FNP [address] [place],  [place] [address]  Chief Complaint: Chest pain  HPI:  Gina Hudson is a 70 y.o. female who is being seen today for the evaluation of chest pain and left bundle branch block at the request of Gina Hudson. She has a history of stroke, chronic LBBB (dating back to at least 2000), hypertension, hyperlipidemia, type 2 diabetes mellitus, and pituitary microadenoma.  Today, Gina Hudson reports that she is feeling well.  A few weeks ago, she had vague chest discomfort when lying down in bed.  She is unable to describe it further but notes that the pain would come and go, typically lasting a few seconds at a time.  There were no associated symptoms such as palpitations, lightheadedness, or dyspnea.  At the time, she had run out of her telmisartan and wonders if being off this medication could have precipitated the symptoms.  She has been back on telmisartan for 2 to 3 weeks and denies further episodes of chest pain.  Gina Hudson reports having had some chest pain many years ago for which she underwent myocardial perfusion stress testing at [facility] (2000).  This was low risk.  She reports a more recent stress test a couple of years ago in [place] and believes it was normal (records not available) she also reports a possible history of heart failure, though she does not recall details.  She believes that her heart failure symptoms resolved spontaneously.  She does not have any residual deficits from her stroke last year.  --------------------------------------------------------------------------------------------------  Cardiovascular History & Procedures: Cardiovascular Problems:  Chest pain  Chronic LBBB  Risk Factors:  Stroke, hypertension, hyperlipidemia, diabetes mellitus, obesity, prior tobacco use, and age greater than 26  Cath/PCI:  None  CV Surgery:  None  EP Procedures and  Devices:  None  Non-Invasive Evaluation(s):  TTE (03/16/2020, [facility]): Normal LV size with moderate LVH.  LVEF greater than 55% with grade 2 diastolic dysfunction.  Mild left atrial enlargement.  No significant valvular abnormality.  Pharmacologic MPI (03/17/2019, [facility]): Fixed anterior apical and anterobasal defects most likely representing attenuation artifact.  LVEF 47% with dyskinesia of the septum consistent with LBBB.  Recent CV Pertinent Labs: Lab Results  Component Value Date   CHOL  11/28/2010    194        ATP III CLASSIFICATION:  <200     mg/dL   Desirable  356-701  mg/dL   Borderline High  >=410    mg/dL   High          HDL 44 11/28/2010   LDLCALC (H) 11/28/2010    134        Total Cholesterol/HDL:CHD Risk Coronary Heart Disease Risk Table                     Men   Women  1/2 Average Risk   3.4   3.3  Average Risk       5.0   4.4  2 X Average Risk   9.6   7.1  3 X Average Risk  23.4   11.0        Use the calculated Patient Ratio above and the CHD Risk Table to determine the patient's CHD Risk.        ATP III CLASSIFICATION (LDL):  <100     mg/dL   Optimal  301-314  mg/dL   Near or Above  Optimal  130-159  mg/dL   Borderline  810-175  mg/dL   High  >102     mg/dL   Very High   TRIG 78 58/52/7782   CHOLHDL 4.4 11/28/2010   K 5.6 (H) 01/04/2020   K 4.2 11/05/2012   BUN 20 01/04/2020   BUN 26 (H) 11/05/2012   CREATININE 1.12 (H) 01/04/2020   CREATININE 0.94 11/05/2012    --------------------------------------------------------------------------------------------------  Past Medical History:  Diagnosis Date  . Diabetes mellitus without complication (HCC)    type II  . Hypertension   . Left bundle branch block     Past Surgical History:  Procedure Laterality Date  . ABDOMINAL HYSTERECTOMY      Current Meds  Medication Sig  . aspirin EC 81 MG tablet Take 1 tablet by mouth daily.  Marland Kitchen atorvastatin (LIPITOR) 10 MG tablet Take 80 mg by  mouth daily.  . clopidogrel (PLAVIX) 75 MG tablet Take 75 mg by mouth daily.  . dapagliflozin propanediol (FARXIGA) 10 MG TABS tablet Take 10 mg by mouth daily.  . Insulin Degludec-Liraglutide (XULTOPHY) 100-3.6 UNIT-MG/ML SOPN Inject 32 Units into the skin daily.  . metFORMIN (GLUCOPHAGE) 1000 MG tablet Take 1 tablet by mouth 2 (two) times daily.  . metoprolol succinate (TOPROL-XL) 100 MG 24 hr tablet Take 1 tablet by mouth daily.  Marland Kitchen spironolactone (ALDACTONE) 50 MG tablet Take 1 tablet by mouth daily.  Marland Kitchen telmisartan (MICARDIS) 20 MG tablet Take 20 mg by mouth daily.    Allergies: Prednisone  Social History   Tobacco Use  . Smoking status: Never Smoker  . Smokeless tobacco: Never Used  Vaping Use  . Vaping Use: Never used  Substance Use Topics  . Alcohol use: Yes    Comment: holidays  . Drug use: No    Family History  Problem Relation Age of Onset  . Prostate cancer Father   . Cancer Mother     Review of Systems: A 12-system review of systems was performed and was negative except as noted in the HPI.  --------------------------------------------------------------------------------------------------  Physical Exam: BP 120/80 (BP Location: Right Arm, Patient Position: Sitting, Cuff Size: Large)   Pulse 83   Ht 5\' 1"  (1.549 m)   Wt 198 lb (89.8 kg)   SpO2 97%   BMI 37.41 kg/m   General: NAD. HEENT: No conjunctival pallor or scleral icterus. Facemask in place. Neck: Supple without lymphadenopathy, thyromegaly, JVD, or HJR. No carotid bruit. Lungs: Normal work of breathing. Clear to auscultation bilaterally without wheezes or crackles. Heart: Regular rate and rhythm without murmurs, rubs, or gallops.  Unable to assess PMI due to body habitus. Abd: Bowel sounds present. Soft, NT/ND without hepatosplenomegaly Ext: No lower extremity edema. Radial, PT, and DP pulses are 2+ bilaterally Skin: Warm and dry without rash. Neuro: CNIII-XII intact. Strength and fine-touch  sensation intact in upper and lower extremities bilaterally. Psych: Normal mood and affect.  EKG: Normal sinus rhythm with left bundle branch block.  Lab Results  Component Value Date   WBC 4.8 08/02/2019   HGB 11.4 (L) 08/02/2019   HCT 34.5 (L) 08/02/2019   MCV 97.5 08/02/2019   PLT 219 08/02/2019    Lab Results  Component Value Date   NA 142 01/04/2020   K 5.6 (H) 01/04/2020   CL 108 (H) 01/04/2020   CO2 18 (L) 01/04/2020   BUN 20 01/04/2020   CREATININE 1.12 (H) 01/04/2020   GLUCOSE 151 (H) 01/04/2020   ALT 22 11/05/2012  Lab Results  Component Value Date   CHOL  11/28/2010    194        ATP III CLASSIFICATION:  <200     mg/dL   Desirable  038-333  mg/dL   Borderline High  >=832    mg/dL   High          HDL 44 11/28/2010   LDLCALC (H) 11/28/2010    134        Total Cholesterol/HDL:CHD Risk Coronary Heart Disease Risk Table                     Men   Women  1/2 Average Risk   3.4   3.3  Average Risk       5.0   4.4  2 X Average Risk   9.6   7.1  3 X Average Risk  23.4   11.0        Use the calculated Patient Ratio above and the CHD Risk Table to determine the patient's CHD Risk.        ATP III CLASSIFICATION (LDL):  <100     mg/dL   Optimal  919-166  mg/dL   Near or Above                    Optimal  130-159  mg/dL   Borderline  060-045  mg/dL   High  >997     mg/dL   Very High   TRIG 78 74/14/2395   CHOLHDL 4.4 11/28/2010    --------------------------------------------------------------------------------------------------  ASSESSMENT AND PLAN: Chest pain: Gina Hudson reports sporadic chest pain when lying down a couple of weeks ago in the setting of being off of telmisartan.  She denies exertional symptoms consistent with angina but has numerous cardiac risk factors.  She also has a chronic LBBB that complicates assessment for ischemic EKG changes.  We have discussed noninvasive testing including pharmacologic myocardial perfusion stress testing and  cardiac CTA.  Gina Hudson wishes to proceed with CTA.  We will recheck her creatinine today given that she has a history of mild renal insufficiency.  We will defer medication changes at this time.  Hyperlipidemia associated with type 2 diabetes mellitus: Continue high intensity statin therapies for secondary prevention of stroke.  Further follow-up and management of lipids and uncontrolled diabetes mellitus (A1c 7.7 on last check in 10/2020) per Gina Hudson.  Morbid obesity: BMI greater than 35 with multiple comorbidities including diabetes mellitus, stroke, hypertension, and hyperlipidemia.  Weight loss encouraged through diet and exercise.  Follow-up: Return to clinic in 1 month.  Yvonne Kendall, MD 02/26/2021 9:20 AM

## 2021-02-26 ENCOUNTER — Ambulatory Visit: Payer: Medicare HMO | Admitting: Internal Medicine

## 2021-02-26 ENCOUNTER — Other Ambulatory Visit: Payer: Self-pay

## 2021-02-26 ENCOUNTER — Encounter: Payer: Self-pay | Admitting: Internal Medicine

## 2021-02-26 VITALS — BP 120/80 | HR 83 | Ht 61.0 in | Wt 198.0 lb

## 2021-02-26 DIAGNOSIS — E1169 Type 2 diabetes mellitus with other specified complication: Secondary | ICD-10-CM

## 2021-02-26 DIAGNOSIS — R072 Precordial pain: Secondary | ICD-10-CM | POA: Diagnosis not present

## 2021-02-26 DIAGNOSIS — E785 Hyperlipidemia, unspecified: Secondary | ICD-10-CM

## 2021-02-26 MED ORDER — METOPROLOL TARTRATE 100 MG PO TABS: ORAL_TABLET | ORAL | 0 refills | Status: DC

## 2021-02-26 NOTE — Addendum Note (Signed)
Addended by: Sherri Rad C on: 02/26/2021 02:43 PM   Modules accepted: Orders

## 2021-02-26 NOTE — Patient Instructions (Addendum)
Medication Instructions:  - Your physician recommends that you continue on your current medications as directed. Please refer to the Current Medication list given to you today.  *If you need a refill on your cardiac medications before your next appointment, please call your pharmacy*   Lab Work: - Your physician recommends that you have lab work today: BMP   If you have labs (blood work) drawn today and your tests are completely normal, you will receive your results only by: Marland Kitchen MyChart Message (if you have MyChart) OR . A paper copy in the mail If you have any lab test that is abnormal or we need to change your treatment, we will call you to review the results.   Testing/Procedures:  1) Cardiac CT angiogram:  Your physician has requested that you have cardiac CT. Cardiac computed tomography (CT) is a painless test that uses an x-ray machine to take clear, detailed pictures of your heart.   Your cardiac CT will be scheduled at one of the below locations:   [facility] [address] [place], [place] [address] [phone]  OR  [facility] [address] [place], [place] [address] [phone]  If scheduled at [facility], please arrive at the [facility] main entrance (entrance A) of [facility] 30 minutes prior to test start time. Proceed to the [facility] Radiology Department (first floor) to check-in and test prep.  If scheduled at [facility] please arrive 15 mins early for check-in and test prep.  Please follow these instructions carefully (unless otherwise directed):   On the Night Before the Test: . Be sure to Drink plenty of water. . Do not consume any caffeinated/decaffeinated beverages or chocolate 12 hours prior to your test. . Do not take any antihistamines 12 hours prior to your test.  On the Day of the Test: . Drink plenty of water until 1 hour prior to  the test. . Do not eat any food 4 hours prior to the test. . You may take your regular medications prior to the test.  . Take metoprolol (Lopressor) 100 mg two hours prior to test. . FEMALES- please wear underwire-free bra if available .       After the Test: . Drink plenty of water. . After receiving IV contrast, you may experience a mild flushed feeling. This is normal. . On occasion, you may experience a mild rash up to 24 hours after the test. This is not dangerous. If this occurs, you can take Benadryl 25 mg and increase your fluid intake. . If you experience trouble breathing, this can be serious. If it is severe call 911 IMMEDIATELY. If it is mild, please call our office. . If you take any of these medications: Glipizide/Metformin, Avandament, Glucavance, please do not take 48 hours after completing test unless otherwise instructed.   Once we have confirmed authorization from your insurance company, we will call you to set up a date and time for your test. Based on how quickly your insurance processes prior authorizations requests, please allow up to 4 weeks to be contacted for scheduling your Cardiac CT appointment. Be advised that routine Cardiac CT appointments could be scheduled as many as 8 weeks after your provider has ordered it.  For non-scheduling related questions, please contact the cardiac imaging nurse navigator should you have any questions/concerns: Rockwell Alexandria, Cardiac Imaging Nurse Navigator Larey Brick, Cardiac Imaging Nurse Navigator [facility]  Direct Office Dial: [phone]   For scheduling needs, including cancellations and rescheduling, please call [place], [phone].    Follow-Up: At [facility], you and your health needs are our priority.  As part of our continuing mission to provide you with exceptional heart care, we have created designated Provider Care Teams.  These Care Teams include your primary Cardiologist  (physician) and Advanced Practice Providers (APPs -  Physician Assistants and Nurse Practitioners) who all work together to provide you with the care you need, when you need it.  We recommend signing up for the patient portal called "MyChart".  Sign up information is provided on this After Visit Summary.  MyChart is used to connect with patients for Virtual Visits (Telemedicine).  Patients are able to view lab/test results, encounter notes, upcoming appointments, etc.  Non-urgent messages can be sent to your provider as well.   To learn more about what you can do with MyChart, go to [email or web address].    Your next appointment:   1 month(s)  The format for your next appointment:   In Person  Provider:   You may see Yvonne Kendallhristopher End, MD or one of the following Advanced Practice Providers on your designated Care Team:    Nicolasa Duckinghristopher Berge, NP  Eula Listenyan Dunn, PA-C  Marisue IvanJacquelyn Visser, PA-C  Cadence Fransico MichaelFurth, [place]PA-C  Gillian Shieldsaitlin Walker, NP    Other Instructions   Cardiac CT Angiogram A cardiac CT angiogram is a procedure to look at the heart and the area around the heart. It may be done to help find the cause of chest pains or other symptoms of heart disease. During this procedure, a substance called contrast dye is injected into the blood vessels in the area to be checked. A large X-ray machine, called a CT scanner, then takes detailed pictures of the heart and the surrounding area. The procedure is also sometimes called a coronary CT angiogram, coronary artery scanning, or CTA. A cardiac CT angiogram allows the health care provider to see how well blood is flowing to and from the heart. The health care provider will be able to see if there are any problems, such as:  Blockage or narrowing of the coronary arteries in the heart.  Fluid around the heart.  Signs of weakness or disease in the muscles, valves, and tissues of the heart. Tell a health care provider about:  Any allergies you have.  This is especially important if you have had a previous allergic reaction to contrast dye.  All medicines you are taking, including vitamins, herbs, eye drops, creams, and over-the-counter medicines.  Any blood disorders you have.  Any surgeries you have had.  Any medical conditions you have.  Whether you are pregnant or may be pregnant.  Any anxiety disorders, chronic pain, or other conditions you have that may increase your stress or prevent you from lying still. What are the risks? Generally, this is a safe procedure. However, problems may occur, including:  Bleeding.  Infection.  Allergic reactions to medicines or dyes.  Damage to other structures or organs.  Kidney damage from the contrast dye that is used.  Increased risk of cancer from radiation exposure. This risk is low. Talk with your health care provider about: ? The risks and benefits of testing. ? How you can receive the lowest dose of radiation. What happens before the procedure?  Wear comfortable clothing and remove any jewelry, glasses, dentures, and hearing aids.  Follow instructions from your health care provider about eating and drinking. This may  include: ? For 12 hours before the procedure -- avoid caffeine. This includes tea, coffee, soda, energy drinks, and diet pills. Drink plenty of water or other fluids that do not have caffeine in them. Being well hydrated can prevent complications. ? For 4-6 hours before the procedure -- stop eating and drinking. The contrast dye can cause nausea, but this is less likely if your stomach is empty.  Ask your health care provider about changing or stopping your regular medicines. This is especially important if you are taking diabetes medicines, blood thinners, or medicines to treat problems with erections (erectile dysfunction). What happens during the procedure?  Hair on your chest may need to be removed so that small sticky patches called electrodes can be placed on  your chest. These will transmit information that helps to monitor your heart during the procedure.  An IV will be inserted into one of your veins.  You might be given a medicine to control your heart rate during the procedure. This will help to ensure that good images are obtained.  You will be asked to lie on an exam table. This table will slide in and out of the CT machine during the procedure.  Contrast dye will be injected into the IV. You might feel warm, or you may get a metallic taste in your mouth.  You will be given a medicine called nitroglycerin. This will relax or dilate the arteries in your heart.  The table that you are lying on will move into the CT machine tunnel for the scan.  The person running the machine will give you instructions while the scans are being done. You may be asked to: ? Keep your arms above your head. ? Hold your breath. ? Stay very still, even if the table is moving.  When the scanning is complete, you will be moved out of the machine.  The IV will be removed. The procedure may vary among health care providers and hospitals.   What can I expect after the procedure? After your procedure, it is common to have:  A metallic taste in your mouth from the contrast dye.  A feeling of warmth.  A headache from the nitroglycerin. Follow these instructions at home:  Take over-the-counter and prescription medicines only as told by your health care provider.  If you are told, drink enough fluid to keep your urine pale yellow. This will help to flush the contrast dye out of your body.  Most people can return to their normal activities right after the procedure. Ask your health care provider what activities are safe for you.  It is up to you to get the results of your procedure. Ask your health care provider, or the department that is doing the procedure, when your results will be ready.  Keep all follow-up visits as told by your health care provider. This  is important. Contact a health care provider if:  You have any symptoms of allergy to the contrast dye. These include: ? Shortness of breath. ? Rash or hives. ? A racing heartbeat. Summary  A cardiac CT angiogram is a procedure to look at the heart and the area around the heart. It may be done to help find the cause of chest pains or other symptoms of heart disease.  During this procedure, a large X-ray machine, called a CT scanner, takes detailed pictures of the heart and the surrounding area after a contrast dye has been injected into blood vessels in the area.  Ask  your health care provider about changing or stopping your regular medicines before the procedure. This is especially important if you are taking diabetes medicines, blood thinners, or medicines to treat erectile dysfunction.  If you are told, drink enough fluid to keep your urine pale yellow. This will help to flush the contrast dye out of your body. This information is not intended to replace advice given to you by your health care provider. Make sure you discuss any questions you have with your health care provider. Document Revised: 07/05/2019 Document Reviewed: 07/05/2019 Elsevier Patient Education  2021 [employer].

## 2021-02-27 LAB — BASIC METABOLIC PANEL
BUN/Creatinine Ratio: 16 (ref 12–28)
BUN: 18 mg/dL (ref 8–27)
CO2: 19 mmol/L — ABNORMAL LOW (ref 20–29)
Calcium: 9.4 mg/dL (ref 8.7–10.3)
Chloride: 106 mmol/L (ref 96–106)
Creatinine, Ser: 1.1 mg/dL — ABNORMAL HIGH (ref 0.57–1.00)
Glucose: 170 mg/dL — ABNORMAL HIGH (ref 65–99)
Potassium: 4.5 mmol/L (ref 3.5–5.2)
Sodium: 140 mmol/L (ref 134–144)
eGFR: 54 mL/min/{1.73_m2} — ABNORMAL LOW (ref >59)

## 2021-02-28 ENCOUNTER — Telehealth: Payer: Self-pay

## 2021-02-28 NOTE — Telephone Encounter (Signed)
-----   Message from Yvonne Kendall, MD sent at 02/28/2021  7:38 AM EDT ----- Stable kidney function and electrolytes.  Ok to proceed with cardiac CTA as planned.

## 2021-02-28 NOTE — Telephone Encounter (Signed)
Jefferey Pica, RN  02/28/2021 3:20 PM EDT Back to Top     The patient has been notified of the result and verbalized understanding. All questions (if any) were answered. Sherri Rad, RN 02/28/2021 3:20 PM

## 2021-02-28 NOTE — Telephone Encounter (Signed)
Called to give the patient lab results. Unable to lmom. Patients voice mail is full. Telephone encounter started.

## 2021-03-26 ENCOUNTER — Telehealth (HOSPITAL_COMMUNITY): Payer: Self-pay | Admitting: Emergency Medicine

## 2021-03-26 NOTE — Telephone Encounter (Signed)
Pt forgot appt and wishes to r/s New appt for 5/12 at 2p  Will inform [facility] [place] to adjust follow up appt with APP  Rockwell Alexandria RN Navigator Cardiac Imaging [facility] [phone] Office  [phone] Cell

## 2021-03-27 ENCOUNTER — Ambulatory Visit: Payer: Medicare HMO

## 2021-03-31 ENCOUNTER — Ambulatory Visit: Payer: Medicare HMO | Admitting: Medical

## 2021-04-01 ENCOUNTER — Other Ambulatory Visit (HOSPITAL_COMMUNITY): Payer: Self-pay | Admitting: *Deleted

## 2021-04-01 ENCOUNTER — Telehealth (HOSPITAL_COMMUNITY): Payer: Self-pay | Admitting: *Deleted

## 2021-04-01 DIAGNOSIS — R072 Precordial pain: Secondary | ICD-10-CM

## 2021-04-01 NOTE — Telephone Encounter (Signed)
Reaching out to patient to offer assistance regarding upcoming cardiac imaging study; pt verbalizes understanding of appt date/time, parking situation and where to check in, pre-test NPO status and medications ordered, and verified current allergies; name and call back number provided for further questions should they arise  Larey Brick RN Navigator Cardiac Imaging [facility] [phone] office [phone] cell  Pt to take 100mg  metoprolol tartrate 2 hours prior to cardiac CT scan.  Pt aware to get blood work prior to CT scan.

## 2021-04-02 ENCOUNTER — Other Ambulatory Visit
Admission: RE | Admit: 2021-04-02 | Discharge: 2021-04-02 | Disposition: A | Discharge status: Home / Self Care | Payer: Medicare HMO | Attending: Internal Medicine | Admitting: Internal Medicine

## 2021-04-02 DIAGNOSIS — R072 Precordial pain: Secondary | ICD-10-CM | POA: Diagnosis present

## 2021-04-02 LAB — BASIC METABOLIC PANEL
Anion gap: 7 (ref 5–15)
BUN: 21 mg/dL (ref 8–23)
CO2: 22 mmol/L (ref 22–32)
Calcium: 9 mg/dL (ref 8.9–10.3)
Chloride: 109 mmol/L (ref 98–111)
Creatinine, Ser: 1.24 mg/dL — ABNORMAL HIGH (ref 0.44–1.00)
GFR, Estimated: 47 mL/min — ABNORMAL LOW (ref >60)
Glucose, Bld: 190 mg/dL — ABNORMAL HIGH (ref 70–99)
Potassium: 4.8 mmol/L (ref 3.5–5.1)
Sodium: 138 mmol/L (ref 135–145)

## 2021-04-03 ENCOUNTER — Other Ambulatory Visit: Payer: Self-pay

## 2021-04-03 ENCOUNTER — Ambulatory Visit
Admission: RE | Admit: 2021-04-03 | Discharge: 2021-04-03 | Disposition: A | Discharge status: Home / Self Care | Payer: Medicare HMO | Source: Ambulatory Visit | Attending: Internal Medicine | Admitting: Internal Medicine

## 2021-04-03 DIAGNOSIS — R072 Precordial pain: Secondary | ICD-10-CM | POA: Insufficient documentation

## 2021-04-03 MED ORDER — DILTIAZEM HCL 25 MG/5ML IV SOLN
10.0000 mg | Freq: Once | INTRAVENOUS | Status: AC
  Administered 2021-04-03: 10 mg via INTRAVENOUS

## 2021-04-03 MED ORDER — METOPROLOL TARTRATE 5 MG/5ML IV SOLN
10.0000 mg | Freq: Once | INTRAVENOUS | Status: AC
  Administered 2021-04-03: 10 mg via INTRAVENOUS

## 2021-04-03 MED ORDER — NITROGLYCERIN 0.4 MG SL SUBL
0.8000 mg | SUBLINGUAL_TABLET | Freq: Once | SUBLINGUAL | Status: AC
  Administered 2021-04-03: 0.8 mg via SUBLINGUAL

## 2021-04-03 MED ORDER — IOHEXOL 350 MG/ML SOLN
100.0000 mL | Freq: Once | INTRAVENOUS | Status: AC | PRN
  Administered 2021-04-03: 100 mL via INTRAVENOUS

## 2021-04-03 NOTE — Progress Notes (Signed)
Patient tolerated CT well. Drank water after. Vital signs stable encourage to drink water throughout day.Reasons explained and verbalized understanding. Ambulated steady gait.  

## 2021-04-07 ENCOUNTER — Ambulatory Visit: Payer: Medicare HMO | Admitting: Medical

## 2021-04-07 NOTE — Progress Notes (Deleted)
Cardiology Office Note:    Date:  04/07/2021   ID:  Gina Hudson, DOB [birth year], MRN [id number]  PCP:  Gina Gang, FNP  [facility] Cardiologist:  None  [facility] Electrophysiologist:  None   Referring MD: Gina Hopes, MD   Chief Complaint: 1 month follow-up.   History of Present Illness:    Gina Hudson is a 70 y.o. female with a hx of left bundle branch block dating back to 2000, hypertension, hyperlipidemia, diabetes type 2, stroke, pituitary microadenoma.  She had a prior myocardial perfusion stress testing at [facility] in 2000 which was low risk.  She also reported a stress test [place] many years ago that was normal (records not available).  Patient was last seen 02/26/2021 reported vague chest pain in setting of being off telmisartan.  Cardiac CTA was ordered.  Coronary CTA showed calcium score of 1.6, 44th percentile for age and sex matched.  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  Recommendations for medical management.  Today,    Past Medical History:  Diagnosis Date  . Diabetes mellitus without complication (HCC)    type II  . Hypertension   . Left bundle branch block     Past Surgical History:  Procedure Laterality Date  . ABDOMINAL HYSTERECTOMY      Current Medications: No outpatient medications have been marked as taking for the 04/07/21 encounter (Appointment) with Gina Hudson, Gina Spickler H, PA-C.     Allergies:   Prednisone   Social History   Socioeconomic History  . Marital status: Married    Spouse name: Not on file  . Number of children: Not on file  . Years of education: Not on file  . Highest education level: Not on file  Occupational History  . Not on file  Tobacco Use  . Smoking status: Never Smoker  . Smokeless tobacco: Never Used  Vaping Use  . Vaping Use: Never used  Substance and Sexual Activity  . Alcohol use: Yes    Alcohol/week: 3.0 standard drinks    Types: 3 Standard drinks or  equivalent per week  . Drug use: No  . Sexual activity: Yes  Other Topics Concern  . Not on file  Social History Narrative  . Not on file   Social Determinants of Health   Financial Resource Strain: Not on file  Food Insecurity: Not on file  Transportation Needs: Not on file  Physical Activity: Not on file  Stress: Not on file  Social Connections: Not on file     Family History: The patient's ***family history includes Cancer in her mother; Prostate cancer in her father.  ROS:   Please see the history of present illness.    *** All other systems reviewed and are negative.  EKGs/Labs/Other Studies Reviewed:    The following studies were reviewed today:   TTE (03/16/2020, [facility]): Normal LV size with moderate LVH.  LVEF greater than 55% with grade 2 diastolic dysfunction.  Mild left atrial enlargement.  No significant valvular abnormality.  Pharmacologic MPI (03/17/2019, [facility]): Fixed anterior apical and anterobasal defects most likely representing attenuation artifact.  LVEF 47% with dyskinesia of the septum consistent with LBBB.   EKG:  EKG is *** ordered today.  The ekg ordered today demonstrates ***  Recent Labs: 04/02/2021: BUN 21; Creatinine, Ser 1.24; Potassium 4.8; Sodium 138  Recent Lipid Panel    Component Value Date/Time   CHOL  11/28/2010 0338    194  ATP III CLASSIFICATION:  <200     mg/dL   Desirable  824-235  mg/dL   Borderline High  >=361    mg/dL   High          TRIG 78 11/28/2010 0338   HDL 44 11/28/2010 0338   CHOLHDL 4.4 11/28/2010 0338   VLDL 16 11/28/2010 0338   LDLCALC (Hudson) 11/28/2010 0338    134        Total Cholesterol/HDL:CHD Risk Coronary Heart Disease Risk Table                     Men   Women  1/2 Average Risk   3.4   3.3  Average Risk       5.0   4.4  2 X Average Risk   9.6   7.1  3 X Average Risk  23.4   11.0        Use the calculated Patient Ratio above and the CHD Risk Table to determine the patient's CHD Risk.        ATP  III CLASSIFICATION (LDL):  <100     mg/dL   Optimal  443-154  mg/dL   Near or Above                    Optimal  130-159  mg/dL   Borderline  008-676  mg/dL   High  >195     mg/dL   Very High     Risk Assessment/Calculations:   {Does this patient have ATRIAL FIBRILLATION?:[phone]}   Physical Exam:    VS:  There were no vitals taken for this visit.    Wt Readings from Last 3 Encounters:  02/26/21 198 lb (89.8 kg)  01/25/20 198 lb (89.8 kg)  01/04/20 198 lb 14.4 oz (90.2 kg)     GEN: *** Well nourished, well developed in no acute distress HEENT: Normal NECK: No JVD; No carotid bruits LYMPHATICS: No lymphadenopathy CARDIAC: ***RRR, no murmurs, rubs, gallops RESPIRATORY:  Clear to auscultation without rales, wheezing or rhonchi  ABDOMEN: Soft, non-tender, non-distended MUSCULOSKELETAL:  No edema; No deformity  SKIN: Warm and dry NEUROLOGIC:  Alert and oriented x 3 PSYCHIATRIC:  Normal affect   ASSESSMENT:    1. Precordial pain   2. Hyperlipidemia associated with type 2 diabetes mellitus (HCC)   3. Morbid obesity (HCC)    PLAN:    In order of problems listed above:  Chest pain  Hyperlipidemia  Obesity   Disposition: Follow up {follow up:15908} with ***   Shared Decision Making/Informed Consent   {Are you ordering a CV Procedure (e.g. stress test, cath, DCCV, TEE, etc)?   Press F2        :[id number]}    Signed, Gina Mohar David Stall, PA-C  04/07/2021 9:41 AM    [facility]

## 2021-12-22 ENCOUNTER — Emergency Department
Admission: EM | Admit: 2021-12-22 | Discharge: 2021-12-23 | Disposition: A | Discharge status: Home / Self Care | Payer: Medicare HMO | Attending: Emergency Medicine | Admitting: Emergency Medicine

## 2021-12-22 ENCOUNTER — Encounter: Payer: Self-pay | Admitting: Emergency Medicine

## 2021-12-22 ENCOUNTER — Other Ambulatory Visit: Payer: Self-pay

## 2021-12-22 DIAGNOSIS — R456 Violent behavior: Secondary | ICD-10-CM | POA: Insufficient documentation

## 2021-12-22 DIAGNOSIS — R4182 Altered mental status, unspecified: Secondary | ICD-10-CM | POA: Diagnosis present

## 2021-12-22 DIAGNOSIS — F600 Paranoid personality disorder: Secondary | ICD-10-CM | POA: Diagnosis not present

## 2021-12-22 DIAGNOSIS — Z20822 Contact with and (suspected) exposure to covid-19: Secondary | ICD-10-CM | POA: Diagnosis not present

## 2021-12-22 DIAGNOSIS — F22 Delusional disorders: Secondary | ICD-10-CM | POA: Diagnosis not present

## 2021-12-22 DIAGNOSIS — F419 Anxiety disorder, unspecified: Secondary | ICD-10-CM | POA: Diagnosis not present

## 2021-12-22 DIAGNOSIS — R4689 Other symptoms and signs involving appearance and behavior: Secondary | ICD-10-CM

## 2021-12-22 LAB — URINALYSIS, COMPLETE (UACMP) WITH MICROSCOPIC
Bacteria, UA: NONE SEEN
Bilirubin Urine: NEGATIVE
Glucose, UA: >1000 mg/dL — AB
Hgb urine dipstick: NEGATIVE
Leukocytes,Ua: NEGATIVE
Nitrite: NEGATIVE
Protein, ur: NEGATIVE mg/dL
Specific Gravity, Urine: 1.015 (ref 1.005–1.030)
pH: 5.5 (ref 5.0–8.0)

## 2021-12-22 LAB — POC URINE PREG, ED: Preg Test, Ur: NEGATIVE

## 2021-12-22 NOTE — ED Notes (Signed)
IVC prior to arrival  
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
## 2021-12-23 DIAGNOSIS — F22 Delusional disorders: Secondary | ICD-10-CM

## 2021-12-23 DIAGNOSIS — F419 Anxiety disorder, unspecified: Secondary | ICD-10-CM | POA: Diagnosis present

## 2021-12-23 DIAGNOSIS — F331 Major depressive disorder, recurrent, moderate: Secondary | ICD-10-CM | POA: Insufficient documentation

## 2021-12-23 DIAGNOSIS — R4689 Other symptoms and signs involving appearance and behavior: Secondary | ICD-10-CM | POA: Diagnosis present

## 2021-12-23 LAB — CBC WITH DIFFERENTIAL/PLATELET
Abs Immature Granulocytes: 0.01 10*3/uL (ref 0.00–0.07)
Basophils Absolute: 0 10*3/uL (ref 0.0–0.1)
Basophils Relative: 0 %
Eosinophils Absolute: 0.2 10*3/uL (ref 0.0–0.5)
Eosinophils Relative: 3 %
HCT: 41.6 % (ref 36.0–46.0)
Hemoglobin: 13.6 g/dL (ref 12.0–15.0)
Immature Granulocytes: 0 %
Lymphocytes Relative: 33 %
Lymphs Abs: 1.8 10*3/uL (ref 0.7–4.0)
MCH: 32.4 pg (ref 26.0–34.0)
MCHC: 32.7 g/dL (ref 30.0–36.0)
MCV: 99 fL (ref 80.0–100.0)
Monocytes Absolute: 0.5 10*3/uL (ref 0.1–1.0)
Monocytes Relative: 9 %
Neutro Abs: 3 10*3/uL (ref 1.7–7.7)
Neutrophils Relative %: 55 %
Platelets: 200 10*3/uL (ref 150–400)
RBC: 4.2 MIL/uL (ref 3.87–5.11)
RDW: 12.5 % (ref 11.5–15.5)
WBC: 5.5 10*3/uL (ref 4.0–10.5)
nRBC: 0 % (ref 0.0–0.2)

## 2021-12-23 LAB — URINE DRUG SCREEN, QUALITATIVE (ARMC ONLY)
Amphetamines, Ur Screen: NOT DETECTED
Barbiturates, Ur Screen: NOT DETECTED
Benzodiazepine, Ur Scrn: NOT DETECTED
Cannabinoid 50 Ng, Ur ~~LOC~~: NOT DETECTED
Cocaine Metabolite,Ur ~~LOC~~: NOT DETECTED
MDMA (Ecstasy)Ur Screen: NOT DETECTED
Methadone Scn, Ur: NOT DETECTED
Opiate, Ur Screen: NOT DETECTED
Phencyclidine (PCP) Ur S: NOT DETECTED
Tricyclic, Ur Screen: NOT DETECTED

## 2021-12-23 LAB — RESP PANEL BY RT-PCR (FLU A&B, COVID) ARPGX2
Influenza A by PCR: NEGATIVE
Influenza B by PCR: NEGATIVE
SARS Coronavirus 2 by RT PCR: NEGATIVE

## 2021-12-23 LAB — COMPREHENSIVE METABOLIC PANEL
ALT: 21 U/L (ref 0–44)
AST: 21 U/L (ref 15–41)
Albumin: 4.1 g/dL (ref 3.5–5.0)
Alkaline Phosphatase: 70 U/L (ref 38–126)
Anion gap: 8 (ref 5–15)
BUN: 24 mg/dL — ABNORMAL HIGH (ref 8–23)
CO2: 21 mmol/L — ABNORMAL LOW (ref 22–32)
Calcium: 9.2 mg/dL (ref 8.9–10.3)
Chloride: 108 mmol/L (ref 98–111)
Creatinine, Ser: 1.36 mg/dL — ABNORMAL HIGH (ref 0.44–1.00)
GFR, Estimated: 42 mL/min — ABNORMAL LOW (ref >60)
Glucose, Bld: 186 mg/dL — ABNORMAL HIGH (ref 70–99)
Potassium: 4.1 mmol/L (ref 3.5–5.1)
Sodium: 137 mmol/L (ref 135–145)
Total Bilirubin: 0.7 mg/dL (ref 0.3–1.2)
Total Protein: 7.1 g/dL (ref 6.5–8.1)

## 2021-12-23 LAB — ACETAMINOPHEN LEVEL: Acetaminophen (Tylenol), Serum: <10 ug/mL — ABNORMAL LOW (ref 10–30)

## 2021-12-23 LAB — CBG MONITORING, ED: Glucose-Capillary: 80 mg/dL (ref 70–99)

## 2021-12-23 LAB — ETHANOL: Alcohol, Ethyl (B): <10 mg/dL (ref <10)

## 2021-12-23 LAB — SALICYLATE LEVEL: Salicylate Lvl: <7 mg/dL — ABNORMAL LOW (ref 7.0–30.0)

## 2021-12-23 MED ORDER — OXYBUTYNIN CHLORIDE ER 10 MG PO TB24
10.0000 mg | ORAL_TABLET | Freq: Every day | ORAL | Status: DC
  Administered 2021-12-23: 10 mg via ORAL
  Filled 2021-12-23: qty 1

## 2021-12-23 MED ORDER — INSULIN DEGLUDEC-LIRAGLUTIDE 100-3.6 UNIT-MG/ML ~~LOC~~ SOPN: 32.0000 [IU] | PEN_INJECTOR | Freq: Every day | SUBCUTANEOUS | Status: DC

## 2021-12-23 MED ORDER — FINERENONE 10 MG PO TABS: 1.0000 | ORAL_TABLET | Freq: Every day | ORAL | Status: DC

## 2021-12-23 MED ORDER — IRBESARTAN 75 MG PO TABS
75.0000 mg | ORAL_TABLET | Freq: Every day | ORAL | Status: DC
  Administered 2021-12-23: 75 mg via ORAL
  Filled 2021-12-23: qty 1

## 2021-12-23 MED ORDER — CLOPIDOGREL BISULFATE 75 MG PO TABS
75.0000 mg | ORAL_TABLET | Freq: Once | ORAL | Status: AC
  Administered 2021-12-23: 75 mg via ORAL
  Filled 2021-12-23: qty 1

## 2021-12-23 MED ORDER — METOPROLOL SUCCINATE ER 50 MG PO TB24
100.0000 mg | ORAL_TABLET | Freq: Every day | ORAL | Status: DC
  Administered 2021-12-23: 100 mg via ORAL
  Filled 2021-12-23: qty 2

## 2021-12-23 MED ORDER — ASPIRIN EC 81 MG PO TBEC
81.0000 mg | DELAYED_RELEASE_TABLET | Freq: Every day | ORAL | Status: DC
  Administered 2021-12-23: 81 mg via ORAL
  Filled 2021-12-23: qty 1

## 2021-12-23 MED ORDER — DAPAGLIFLOZIN PROPANEDIOL 5 MG PO TABS
10.0000 mg | ORAL_TABLET | Freq: Every day | ORAL | Status: DC
  Administered 2021-12-23: 10 mg via ORAL
  Filled 2021-12-23: qty 2
  Filled 2021-12-23: qty 1

## 2021-12-23 MED ORDER — SPIRONOLACTONE 25 MG PO TABS
50.0000 mg | ORAL_TABLET | Freq: Every day | ORAL | Status: DC
  Administered 2021-12-23: 50 mg via ORAL
  Filled 2021-12-23: qty 2

## 2021-12-23 MED ORDER — ATORVASTATIN CALCIUM 20 MG PO TABS
80.0000 mg | ORAL_TABLET | Freq: Every day | ORAL | Status: DC
  Administered 2021-12-23: 80 mg via ORAL
  Filled 2021-12-23: qty 4
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
## 2021-12-23 NOTE — ED Notes (Signed)
Vs assessed. Shower offered.  
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
## 2021-12-23 NOTE — ED Notes (Signed)
PMHNP at bedside ?

## 2021-12-23 NOTE — ED Notes (Signed)
BREAKFAST TRAY GIVEN 

## 2021-12-23 NOTE — BH Assessment (Signed)
This writer attempted to contact pt's daughter for collateral; however, there was no answer/ability to leave voicemail.
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
## 2021-12-25 LAB — URINE CULTURE: Culture: >=100000 — AB

## 2022-01-13 ENCOUNTER — Other Ambulatory Visit: Payer: Self-pay | Admitting: Family Medicine

## 2022-01-13 DIAGNOSIS — Z1231 Encounter for screening mammogram for malignant neoplasm of breast: Secondary | ICD-10-CM

## 2022-03-03 ENCOUNTER — Other Ambulatory Visit: Payer: Self-pay | Admitting: Internal Medicine

## 2022-09-24 ENCOUNTER — Emergency Department
Admission: EM | Admit: 2022-09-24 | Discharge: 2022-09-24 | Disposition: A | Discharge status: Home / Self Care | Payer: Medicare HMO | Attending: Emergency Medicine | Admitting: Emergency Medicine

## 2022-09-24 ENCOUNTER — Emergency Department: Payer: Medicare HMO

## 2022-09-24 DIAGNOSIS — R109 Unspecified abdominal pain: Secondary | ICD-10-CM | POA: Insufficient documentation

## 2022-09-24 DIAGNOSIS — R519 Headache, unspecified: Secondary | ICD-10-CM | POA: Insufficient documentation

## 2022-09-24 LAB — CBC WITH DIFFERENTIAL/PLATELET
Abs Immature Granulocytes: 0.02 10*3/uL (ref 0.00–0.07)
Basophils Absolute: 0 10*3/uL (ref 0.0–0.1)
Basophils Relative: 1 %
Eosinophils Absolute: 0.2 10*3/uL (ref 0.0–0.5)
Eosinophils Relative: 3 %
HCT: 41.6 % (ref 36.0–46.0)
Hemoglobin: 14 g/dL (ref 12.0–15.0)
Immature Granulocytes: 0 %
Lymphocytes Relative: 27 %
Lymphs Abs: 1.8 10*3/uL (ref 0.7–4.0)
MCH: 32.6 pg (ref 26.0–34.0)
MCHC: 33.7 g/dL (ref 30.0–36.0)
MCV: 97 fL (ref 80.0–100.0)
Monocytes Absolute: 0.6 10*3/uL (ref 0.1–1.0)
Monocytes Relative: 10 %
Neutro Abs: 3.8 10*3/uL (ref 1.7–7.7)
Neutrophils Relative %: 59 %
Platelets: 191 10*3/uL (ref 150–400)
RBC: 4.29 MIL/uL (ref 3.87–5.11)
RDW: 12.3 % (ref 11.5–15.5)
WBC: 6.5 10*3/uL (ref 4.0–10.5)
nRBC: 0 % (ref 0.0–0.2)

## 2022-09-24 LAB — COMPREHENSIVE METABOLIC PANEL
ALT: 46 U/L — ABNORMAL HIGH (ref 0–44)
AST: 51 U/L — ABNORMAL HIGH (ref 15–41)
Albumin: 4.2 g/dL (ref 3.5–5.0)
Alkaline Phosphatase: 87 U/L (ref 38–126)
Anion gap: 8 (ref 5–15)
BUN: 35 mg/dL — ABNORMAL HIGH (ref 8–23)
CO2: 19 mmol/L — ABNORMAL LOW (ref 22–32)
Calcium: 9.4 mg/dL (ref 8.9–10.3)
Chloride: 110 mmol/L (ref 98–111)
Creatinine, Ser: 1.23 mg/dL — ABNORMAL HIGH (ref 0.44–1.00)
GFR, Estimated: 47 mL/min — ABNORMAL LOW (ref >60)
Glucose, Bld: 203 mg/dL — ABNORMAL HIGH (ref 70–99)
Potassium: 3.9 mmol/L (ref 3.5–5.1)
Sodium: 137 mmol/L (ref 135–145)
Total Bilirubin: 1.1 mg/dL (ref 0.3–1.2)
Total Protein: 7.5 g/dL (ref 6.5–8.1)

## 2022-09-24 LAB — URINALYSIS, ROUTINE W REFLEX MICROSCOPIC
Bacteria, UA: NONE SEEN
Bilirubin Urine: NEGATIVE
Glucose, UA: >=500 mg/dL — AB
Hgb urine dipstick: NEGATIVE
Ketones, ur: 5 mg/dL — AB
Leukocytes,Ua: NEGATIVE
Nitrite: NEGATIVE
Protein, ur: NEGATIVE mg/dL
Specific Gravity, Urine: 1.025 (ref 1.005–1.030)
WBC, UA: NONE SEEN WBC/hpf (ref 0–5)
pH: 5 (ref 5.0–8.0)

## 2022-09-24 MED ORDER — FENTANYL CITRATE PF 50 MCG/ML IJ SOSY
50.0000 ug | PREFILLED_SYRINGE | Freq: Once | INTRAMUSCULAR | Status: AC
Start: 2022-09-24 — End: 2022-09-24
  Administered 2022-09-24: 50 ug via INTRAVENOUS
  Filled 2022-09-24: qty 1

## 2022-09-24 MED ORDER — TRAMADOL HCL 50 MG PO TABS: 50.0000 mg | ORAL_TABLET | Freq: Four times a day (QID) | ORAL | 0 refills | Status: AC | PRN

## 2022-09-24 NOTE — ED Notes (Signed)
Pt back from CT - attached to the monitor.

## 2022-09-24 NOTE — ED Notes (Signed)
E-signature pad unavailable - Pt verbalized understanding of D/C information - no additional concerns at this time.  

## 2022-09-24 NOTE — Discharge Instructions (Signed)
Please seek medical attention for any high fevers, chest pain, shortness of breath, change in behavior, persistent vomiting, bloody stool or any other new or concerning symptoms.  

## 2022-09-24 NOTE — ED Provider Notes (Signed)
[facility] Provider Note    Event Date/Time   First MD Initiated Contact with Patient 09/24/22 [phone]     (approximate)   History   Flank Pain   HPI  Gina Hudson is a 71 y.o. female  who presents to the emergency department today because of concern for headache and flank/back pain. Symptoms started about a week ago. Have been fairly constant. Worse tonight. They are causing her to have problems sleeping. The back and flank pain she has had before and states that it can get so bad she can't walk. In terms of the headache that is new for her. Located in the back of her head. She denies any recent trauma. Denies any fevers.     Physical Exam   Triage Vital Signs: ED Triage Vitals  Enc Vitals Group     BP 09/24/22 0407 (!) 135/101     Pulse Rate 09/24/22 0407 90     Resp 09/24/22 0407 18     Temp 09/24/22 0407 97.7 F (36.5 C)     Temp Source 09/24/22 0407 Oral     SpO2 09/24/22 0407 99 %     Weight 09/24/22 0409 188 lb (85.3 kg)     Height 09/24/22 0406 5\' 1"  (1.549 m)     Head Circumference --      Peak Flow --      Pain Score 09/24/22 0406 7     Pain Loc --      Pain Edu? --      Excl. in [place]? --     Most recent vital signs: Vitals:   09/24/22 0407  BP: (!) 135/101  Pulse: 90  Resp: 18  Temp: 97.7 F (36.5 C)  SpO2: 99%   General: Awake, alert, oriented. CV:  Good peripheral perfusion. Regular rate and rhythm. Resp:  Normal effort. Lungs clear. Abd:  No distention. Non tender.    ED Results / Procedures / Treatments   Labs (all labs ordered are listed, but only abnormal results are displayed) Labs Reviewed  COMPREHENSIVE METABOLIC PANEL - Abnormal; Notable for the following components:      Result Value   CO2 19 (*)    Glucose, Bld 203 (*)    BUN 35 (*)    Creatinine, Ser 1.23 (*)    AST 51 (*)    ALT 46 (*)    GFR, Estimated 47 (*)    All other components within normal limits  URINALYSIS, ROUTINE W REFLEX MICROSCOPIC -  Abnormal; Notable for the following components:   Color, Urine YELLOW (*)    APPearance HAZY (*)    Glucose, UA >=500 (*)    Ketones, ur 5 (*)    All other components within normal limits  CBC WITH DIFFERENTIAL/PLATELET     EKG  I, Nance Pear, attending physician, personally viewed and interpreted this EKG  EKG Time: 0407 Rate: 86 Rhythm: sinus rhythm Axis: normal Intervals: qtc 493 QRS: LBBB ST changes: no st elevation Impression: abnormal ekg   RADIOLOGY I independently interpreted and visualized the CT head. My interpretation: No bleed Radiology interpretation:  IMPRESSION:  No acute finding or explanation for headache.     PROCEDURES:  Critical Care performed: No  Procedures   MEDICATIONS ORDERED IN ED: Medications - No data to display   IMPRESSION / MDM / [place] / ED COURSE  I reviewed the triage vital signs and the nursing notes.  Differential diagnosis includes, but is not limited to, UTI, kidney stone, intracranial bleed.  Patient's presentation is most consistent with acute presentation with potential threat to life or bodily function.  Patient presents to the emergency department today because of concerns for both headache as well as back/flank pain.  In terms of the headache CT head was negative.  Headache did improve with pain medication.  No neurologic deficits.  This time I have very low concern for intracranial bleed.  In terms of the back flank pain that is also improved with medication.  Urine was negative for infection.  I did have a discussion with the patient about obtaining imaging for the back and flank pain.  However given reassuring blood work and urine and given the fact the patient has had similar pain in the past I did discuss with patient I thought more likely a skeletal or muscle issue.  This time patient felt comfortable deferring.  Think this is reasonable.  Will give patient prescription  for pain medication.  She states that she has appointment already scheduled with PCP.  Did discuss return precautions.   FINAL CLINICAL IMPRESSION(S) / ED DIAGNOSES   Final diagnoses:  Flank pain  Bad headache    Note:  This document was prepared using Dragon voice recognition software and may include unintentional dictation errors.    Phineas Semen, MD 09/24/22 [phone]

## 2022-09-24 NOTE — ED Triage Notes (Addendum)
Pt presents via EMS with complaints of right flank pain that started 3 hours ago. Pt describes the pain as 7/10 and sharp in nature with associated headache and dizziness. Denies urinary sx, CP or SOB.    VS with EMS 141/102 97 100% RA LBBB on 12 lead CBG-219

## 2022-09-24 NOTE — ED Notes (Signed)
Pt to CT

## 2022-12-22 ENCOUNTER — Encounter: Admission: RE | Payer: Self-pay | Source: Home / Self Care

## 2022-12-22 ENCOUNTER — Ambulatory Visit: Admission: RE | Admit: 2022-12-22 | Payer: Medicare HMO | Source: Home / Self Care

## 2022-12-22 SURGERY — COLONOSCOPY WITH PROPOFOL
Anesthesia: General

## 2023-04-02 ENCOUNTER — Other Ambulatory Visit: Payer: Self-pay | Admitting: Family Medicine

## 2023-04-02 ENCOUNTER — Encounter: Payer: Self-pay | Admitting: Family Medicine

## 2023-04-02 DIAGNOSIS — R1011 Right upper quadrant pain: Secondary | ICD-10-CM

## 2023-04-22 ENCOUNTER — Ambulatory Visit
Admission: RE | Admit: 2023-04-22 | Discharge: 2023-04-22 | Disposition: A | Discharge status: Home / Self Care | Payer: Medicare HMO | Source: Ambulatory Visit | Attending: Family Medicine | Admitting: Family Medicine

## 2023-04-22 DIAGNOSIS — R1011 Right upper quadrant pain: Secondary | ICD-10-CM

## 2023-05-21 ENCOUNTER — Other Ambulatory Visit: Payer: Self-pay | Admitting: Family Medicine

## 2023-05-21 DIAGNOSIS — Z1231 Encounter for screening mammogram for malignant neoplasm of breast: Secondary | ICD-10-CM

## 2023-06-07 ENCOUNTER — Other Ambulatory Visit: Payer: Self-pay | Admitting: Family Medicine

## 2023-06-07 DIAGNOSIS — F03918 Unspecified dementia, unspecified severity, with other behavioral disturbance: Secondary | ICD-10-CM

## 2023-06-15 ENCOUNTER — Ambulatory Visit
Admission: RE | Admit: 2023-06-15 | Discharge: 2023-06-15 | Disposition: A | Discharge status: Home / Self Care | Payer: Medicare HMO | Source: Ambulatory Visit | Attending: Family Medicine | Admitting: Family Medicine

## 2023-06-15 DIAGNOSIS — F03918 Unspecified dementia, unspecified severity, with other behavioral disturbance: Secondary | ICD-10-CM | POA: Insufficient documentation

## 2023-08-30 ENCOUNTER — Ambulatory Visit: Payer: Medicare HMO

## 2023-09-06 ENCOUNTER — Ambulatory Visit: Payer: Medicare HMO

## 2023-09-13 ENCOUNTER — Ambulatory Visit: Payer: Medicare HMO

## 2023-09-20 ENCOUNTER — Telehealth: Payer: Self-pay | Admitting: Licensed Clinical Social Worker

## 2023-09-20 NOTE — Telephone Encounter (Signed)
Patient lvm requesting information regarding obtaining a mental health assessment. LCSW returned patient's call and spoke with patient informing her that due to her not being a patient of [facility] we would not be the appropriate place. And also provided contact information for [facility] because there was a message in patient's chart that they had tried to contact her.

## 2023-10-18 ENCOUNTER — Ambulatory Visit: Payer: Medicare HMO | Admitting: Psychiatry

## 2023-12-01 ENCOUNTER — Other Ambulatory Visit: Payer: Self-pay | Admitting: Internal Medicine

## 2023-12-01 DIAGNOSIS — Z1231 Encounter for screening mammogram for malignant neoplasm of breast: Secondary | ICD-10-CM

## 2024-04-09 NOTE — Progress Notes (Deleted)
 Psychiatric Initial Adult Assessment   Patient Identification: Gina Hudson MRN:  [id number] Date of Evaluation:  04/09/2024 Referral Source: *** Chief Complaint:  No chief complaint on file.  Visit Diagnosis: No diagnosis found.  History of Present Illness:   Gina Hudson is a 73 y.o. year old female with a history of left thalamic ischemic CVA, hyperlipidemia ,hypertension, type II diabetes, stage III CKD, pituitary adenoma, who is referred for unspecified dementia.   According to the chart review, she was brought to ED under IVC 11/2021.  Gina Hudson is a 73 y.o. female who reports no contributory past medical history and presents under involuntary commitment for psychiatric evaluation.  She was IVC by her spouse for alleges aggressive behavior, destroying his computer, increasing paranoia, and keeping knives in her room.  She states that she destroyed his computer and "tore up the house", but claims that he is framing her because of his infidelity and that he is trying to take all her stuff away from her.   Head MRI 05/2023 IMPRESSION: 1. No age advanced or lobar predominant parenchymal atrophy. 2. 4 mm dural-based mass overlying the anterior right frontal lobe most consistent with a meningioma. No significant mass effect on the underlying brain parenchyma. No underlying parenchymal edema. 3. A known pituitary mass (which was present on the prior brain MRI of 03/13/2020) is incompletely reassessed on today's non-contrast examination. 4. Known chronic lacunar infarct at the left thalamocapsular junction. 5. Background minimal cerebral white matter chronic small vessel ischemic disease. 6. Bilateral proptosis. 7. Mucous retention cysts within the bilateral maxillary sinuses. 8. Small-volume fluid within the right mastoid air cells.      Associated Signs/Symptoms: Depression Symptoms:  {DEPRESSION SYMPTOMS:20000} (Hypo) Manic Symptoms:  {BHH MANIC SYMPTOMS:22872} Anxiety  Symptoms:  {BHH ANXIETY SYMPTOMS:22873} Psychotic Symptoms:  {BHH PSYCHOTIC SYMPTOMS:22874} PTSD Symptoms: {BHH PTSD SYMPTOMS:22875}  Past Psychiatric History:  Outpatient:  Psychiatry admission:  Previous suicide attempt:  Past trials of medication:  History of violence:  History of head injury:   Previous Psychotropic Medications: {YES/NO:21197}  Substance Abuse History in the last 12 months:  {yes no:314532}  Consequences of Substance Abuse: {BHH CONSEQUENCES OF SUBSTANCE ABUSE:22880}  Past Medical History:  Past Medical History:  Diagnosis Date   Diabetes mellitus without complication (HCC)    type II   Hypertension    Left bundle branch block     Past Surgical History:  Procedure Laterality Date   ABDOMINAL HYSTERECTOMY      Family Psychiatric History: ***  Family History:  Family History  Problem Relation Age of Onset   Prostate cancer Father    Cancer Mother     Social History:   Social History   Socioeconomic History   Marital status: Married    Spouse name: Not on file   Number of children: Not on file   Years of education: Not on file   Highest education level: Not on file  Occupational History   Not on file  Tobacco Use   Smoking status: Never   Smokeless tobacco: Never  Vaping Use   Vaping status: Never Used  Substance and Sexual Activity   Alcohol use: Yes    Alcohol/week: 3.0 standard drinks of alcohol    Types: 3 Standard drinks or equivalent per week   Drug use: No   Sexual activity: Yes  Other Topics Concern   Not on file  Social History Narrative   Not on file   Social Drivers of Corporate investment banker  Strain: Medium Risk (03/31/2024)   Received from [facility]   Overall Financial Resource Strain (CARDIA)    Difficulty of Paying Living Expenses: Somewhat hard  Food Insecurity: Food Insecurity Present (03/31/2024)   Received from [facility]   Hunger Vital Sign    Worried About Running  Out of Food in the Last Year: Sometimes true    Ran Out of Food in the Last Year: Sometimes true  Transportation Needs: No Transportation Needs (03/31/2024)   Received from [facility] - Transportation    In the past 12 months, has lack of transportation kept you from medical appointments or from getting medications?: No    Lack of Transportation (Non-Medical): No  Physical Activity: Not on file  Stress: Not on file  Social Connections: Not on file    Additional Social History: ***  Allergies:   Allergies  Allergen Reactions   Prednisone Other (See Comments)    hyperglycemia    Metabolic Disorder Labs: No results found for: "HGBA1C", "MPG" No results found for: "PROLACTIN" Lab Results  Component Value Date   CHOL  11/28/2010    194        ATP III CLASSIFICATION:  <200     mg/dL   Desirable  578-469  mg/dL   Borderline High  >=629    mg/dL   High          TRIG 78 11/28/2010   HDL 44 11/28/2010   CHOLHDL 4.4 11/28/2010   VLDL 16 11/28/2010   LDLCALC (H) 11/28/2010    134        Total Cholesterol/HDL:CHD Risk Coronary Heart Disease Risk Table                     Men   Women  1/2 Average Risk   3.4   3.3  Average Risk       5.0   4.4  2 X Average Risk   9.6   7.1  3 X Average Risk  23.4   11.0        Use the calculated Patient Ratio above and the CHD Risk Table to determine the patient's CHD Risk.        ATP III CLASSIFICATION (LDL):  <100     mg/dL   Optimal  528-413  mg/dL   Near or Above                    Optimal  130-159  mg/dL   Borderline  244-010  mg/dL   High  >272     mg/dL   Very High   Lab Results  Component Value Date   TSH 2.455 11/27/2010    Therapeutic Level Labs: No results found for: "LITHIUM" No results found for: "CBMZ" No results found for: "VALPROATE"  Current Medications: Current Outpatient Medications  Medication Sig Dispense Refill   aspirin  EC 81 MG tablet Take 1 tablet by mouth daily.      atorvastatin  (LIPITOR) 80 MG tablet Take 80 mg by mouth daily.     azithromycin (ZITHROMAX) 250 MG tablet Take 250 mg by mouth as directed. (Patient not taking: Reported on 12/23/2021)     clopidogrel  (PLAVIX ) 75 MG tablet Take 75 mg by mouth daily.     dapagliflozin  propanediol (FARXIGA ) 10 MG TABS tablet Take 10 mg by mouth daily.     Insulin  Degludec-Liraglutide  (XULTOPHY ) 100-3.6 UNIT-MG/ML SOPN Inject 32 Units into the  skin daily.     KERENDIA  10 MG TABS Take 1 tablet by mouth daily.     metoprolol  succinate (TOPROL -XL) 100 MG 24 hr tablet Take 1 tablet by mouth daily.     oxybutynin  (DITROPAN -XL) 10 MG 24 hr tablet Take 10 mg by mouth daily.     spironolactone  (ALDACTONE ) 50 MG tablet Take 1 tablet by mouth daily.     telmisartan (MICARDIS) 20 MG tablet Take 20 mg by mouth daily.     No current facility-administered medications for this visit.    Musculoskeletal: Strength & Muscle Tone: within normal limits Gait & Station: normal Patient leans: N/A  Psychiatric Specialty Exam: Review of Systems  There were no vitals taken for this visit.There is no height or weight on file to calculate BMI.  General Appearance: {Appearance:22683}  Eye Contact:  {BHH EYE CONTACT:22684}  Speech:  Clear and Coherent  Volume:  Normal  Mood:  {BHH MOOD:22306}  Affect:  {Affect (PAA):22687}  Thought Process:  Coherent  Orientation:  Full (Time, Place, and Person)  Thought Content:  Logical  Suicidal Thoughts:  {ST/HT (PAA):22692}  Homicidal Thoughts:  {ST/HT (PAA):22692}  Memory:  Immediate;   Good  Judgement:  {Judgement (PAA):22694}  Insight:  {Insight (PAA):22695}  Psychomotor Activity:  Normal  Concentration:  Concentration: Good and Attention Span: Good  Recall:  Good  Fund of Knowledge:Good  Language: Good  Akathisia:  No  Handed:  Right  AIMS (if indicated):  not done  Assets:  Communication Skills Desire for Improvement  ADL's:  Intact  Cognition: WNL  Sleep:  {BHH  GOOD/FAIR/POOR:22877}   Screenings: Flowsheet Row ED from 09/24/2022 in [facility] Emergency Department at [facility] from 12/22/2021 in [facility] Department at [facility]  C-SSRS RISK CATEGORY No Risk No Risk       Assessment and Plan:    Plan   The patient demonstrates the following risk factors for suicide: Chronic risk factors for suicide include: {Chronic Risk Factors for GNFAOZH:08657846}. Acute risk factors for suicide include: {Acute Risk Factors for NGEXBMW:41324401}. Protective factors for this patient include: {Protective Factors for Suicide UUVO:53664403}. Considering these factors, the overall suicide risk at this point appears to be {Desc; low/moderate/high:110033}. Patient {ACTION; IS/IS KVQ:25956387} appropriate for outpatient follow up.   Collaboration of Care: {BH OP Collaboration of Care:21014065}  Patient/Guardian was advised Release of Information must be obtained prior to any record release in order to collaborate their care with an outside provider. Patient/Guardian was advised if they have not already done so to contact the registration department to sign all necessary forms in order for us  to release information regarding their care.   Consent: Patient/Guardian gives verbal consent for treatment and assignment of benefits for services provided during this visit. Patient/Guardian expressed understanding and agreed to proceed.   Todd Fossa, MD 5/18/20259:07 AM

## 2024-04-13 ENCOUNTER — Ambulatory Visit: Payer: Self-pay | Admitting: Psychiatry

## 2024-04-17 ENCOUNTER — Other Ambulatory Visit: Payer: Self-pay

## 2024-04-17 ENCOUNTER — Emergency Department
Admission: EM | Admit: 2024-04-17 | Discharge: 2024-04-17 | Disposition: A | Discharge status: Home / Self Care | Attending: Emergency Medicine | Admitting: Emergency Medicine

## 2024-04-17 ENCOUNTER — Emergency Department

## 2024-04-17 ENCOUNTER — Encounter: Payer: Self-pay | Admitting: Emergency Medicine

## 2024-04-17 DIAGNOSIS — N183 Chronic kidney disease, stage 3 unspecified: Secondary | ICD-10-CM | POA: Insufficient documentation

## 2024-04-17 DIAGNOSIS — E1165 Type 2 diabetes mellitus with hyperglycemia: Secondary | ICD-10-CM | POA: Diagnosis not present

## 2024-04-17 DIAGNOSIS — H538 Other visual disturbances: Secondary | ICD-10-CM | POA: Diagnosis not present

## 2024-04-17 DIAGNOSIS — Z794 Long term (current) use of insulin: Secondary | ICD-10-CM | POA: Diagnosis not present

## 2024-04-17 DIAGNOSIS — R5383 Other fatigue: Secondary | ICD-10-CM | POA: Diagnosis present

## 2024-04-17 DIAGNOSIS — R739 Hyperglycemia, unspecified: Secondary | ICD-10-CM

## 2024-04-17 DIAGNOSIS — R519 Headache, unspecified: Secondary | ICD-10-CM

## 2024-04-17 LAB — BASIC METABOLIC PANEL WITH GFR
Anion gap: 8 (ref 5–15)
BUN: 27 mg/dL — ABNORMAL HIGH (ref 8–23)
CO2: 21 mmol/L — ABNORMAL LOW (ref 22–32)
Calcium: 8.5 mg/dL — ABNORMAL LOW (ref 8.9–10.3)
Chloride: 110 mmol/L (ref 98–111)
Creatinine, Ser: 1.27 mg/dL — ABNORMAL HIGH (ref 0.44–1.00)
GFR, Estimated: 45 mL/min — ABNORMAL LOW (ref >60)
Glucose, Bld: 225 mg/dL — ABNORMAL HIGH (ref 70–99)
Potassium: 3.8 mmol/L (ref 3.5–5.1)
Sodium: 139 mmol/L (ref 135–145)

## 2024-04-17 LAB — CBC
HCT: 40.1 % (ref 36.0–46.0)
Hemoglobin: 13.4 g/dL (ref 12.0–15.0)
MCH: 32.4 pg (ref 26.0–34.0)
MCHC: 33.4 g/dL (ref 30.0–36.0)
MCV: 97.1 fL (ref 80.0–100.0)
Platelets: 173 10*3/uL (ref 150–400)
RBC: 4.13 MIL/uL (ref 3.87–5.11)
RDW: 12 % (ref 11.5–15.5)
WBC: 4.1 10*3/uL (ref 4.0–10.5)
nRBC: 0 % (ref 0.0–0.2)

## 2024-04-17 LAB — SEDIMENTATION RATE: Sed Rate: 8 mm/h (ref 0–30)

## 2024-04-17 LAB — CBG MONITORING, ED
Glucose-Capillary: 209 mg/dL — ABNORMAL HIGH (ref 70–99)
Glucose-Capillary: 322 mg/dL — ABNORMAL HIGH (ref 70–99)

## 2024-04-17 LAB — C-REACTIVE PROTEIN: CRP: 0.8 mg/dL (ref <1.0)

## 2024-04-17 MED ORDER — INSULIN ASPART 100 UNIT/ML IJ SOLN
5.0000 [IU] | Freq: Once | INTRAMUSCULAR | Status: AC
Start: 2024-04-17 — End: 2024-04-17
  Administered 2024-04-17: 5 [IU] via SUBCUTANEOUS
  Filled 2024-04-17: qty 1

## 2024-04-17 MED ORDER — ACETAMINOPHEN 500 MG PO TABS
1000.0000 mg | ORAL_TABLET | Freq: Once | ORAL | Status: AC
Start: 2024-04-17 — End: 2024-04-17
  Administered 2024-04-17: 1000 mg via ORAL
  Filled 2024-04-17: qty 2

## 2024-04-17 MED ORDER — TETRACAINE HCL 0.5 % OP SOLN
1.0000 [drp] | Freq: Once | OPHTHALMIC | Status: AC
  Administered 2024-04-17: 1 [drp] via OPHTHALMIC
  Filled 2024-04-17: qty 4

## 2024-04-17 MED ORDER — FLUORESCEIN SODIUM 1 MG OP STRP
1.0000 | ORAL_STRIP | Freq: Once | OPHTHALMIC | Status: AC
Start: 2024-04-17 — End: 2024-04-17
  Administered 2024-04-17: 1 via OPHTHALMIC
  Filled 2024-04-17: qty 1

## 2024-04-17 NOTE — Discharge Instructions (Addendum)
 You are seen in the emergency department for an elevated glucose.  You had a CT scan of your head that did not show any new abnormalities.  Your glucose was in the 200s in the emergency department.  Your eye pressures were normal.  Your lab work was overall at your normal.  Follow-up with your eye doctor tomorrow.  Follow-up with your primary care physician this week.  Return to the emergency department for any worsening symptoms.

## 2024-04-17 NOTE — ED Triage Notes (Signed)
 Pt via POV from home. Pt c/o hyperglycemia report that she has been without her medication for 2 weeks. Pt c/o headache. Reports that she has been having troubles with consistently having her medications. Reports CBG in 400s. Pt is A&Ox4 and NAD, ambulatory to triage.

## 2024-04-17 NOTE — ED Provider Notes (Signed)
 [facility] Provider Note    Event Date/Time   First MD Initiated Contact with Patient 04/17/24 [phone]     (approximate)   History   Hyperglycemia   HPI  Gina Hudson is a 73 y.o. female past medical history significant for diabetes, history of CVA, CKD, pituitary microadenoma, presents to the emergency department for not feeling well.  States that she has been having difficulty controlling her blood glucose.  States that she went 1 month without her insulin  because her insurance would not pay for it.  States that she has continued on her Farxiga .  States that she has had glucose levels in the 200s and recently evaluated by her primary care physician on 5/19.  States that over the past 2 days she has been having a headache with blurry vision.  States that her blurry vision is in both eyes and denies any double vision.  Worse in the morning and improves throughout the day.  Wears glasses but no contacts.  States that she has a scheduled appointment with her eye doctor tomorrow.  Denies any falls or trauma.  States that she was having some episodes of dizziness but does not feel dizzy at this time.  No room spinning.  Not feeling off balance.  No numbness or weakness in extremities.  No sudden onset of headache.  Denies chest pain or shortness of breath.  Denies jaw claudication.  Denies urinary urgency or frequency.  No burning with urination.  Review had a recent office visit on 04/10/2024.  Patient had known poorly controlled diabetes at that time with a recent A1c of 9.  Also has a history of CKD stage III.  Uses insulin  for her diabetes.  Takes Farxiga  for diabetes and CKD.  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 and has scheduled labs as an outpatient including thyroid studies, cortisol level, ACTH     Physical Exam   Triage Vital Signs: ED Triage Vitals  Encounter Vitals Group     BP 04/17/24 0759 115/67     Systolic BP Percentile --       Diastolic BP Percentile --      Pulse Rate 04/17/24 0759 69     Resp 04/17/24 0759 18     Temp 04/17/24 0759 98.2 F (36.8 C)     Temp Source 04/17/24 0759 Oral     SpO2 04/17/24 0759 96 %     Weight 04/17/24 0756 196 lb (88.9 kg)     Height 04/17/24 0756 5\' 2"  (1.575 m)     Head Circumference --      Peak Flow --      Pain Score 04/17/24 0756 8     Pain Loc --      Pain Education --      Exclude from Growth Chart --     Most recent vital signs: Vitals:   04/17/24 0759  BP: 115/67  Pulse: 69  Resp: 18  Temp: 98.2 F (36.8 C)  SpO2: 96%    Physical Exam Constitutional:      Appearance: She is well-developed.  HENT:     Head: Atraumatic.     Comments: No temporal artery tenderness to palpation Eyes:     General: Lids are normal.     Intraocular pressure: Right eye pressure is 8 mmHg. Left eye pressure is 12 mmHg.     Extraocular Movements: Extraocular movements intact.     Conjunctiva/sclera: Conjunctivae normal.     Pupils:  Pupils are equal, round, and reactive to light.     Comments: Pupils are equal and reactive and extraocular movements intact.  Normal peripheral vision.  No nystagmus.  Cardiovascular:     Rate and Rhythm: Regular rhythm.     Pulses: Normal pulses.  Pulmonary:     Effort: No respiratory distress.  Abdominal:     General: There is no distension.     Tenderness: There is no abdominal tenderness.  Musculoskeletal:        General: Normal range of motion.     Cervical back: Normal range of motion and neck supple.     Right lower leg: No edema.     Left lower leg: No edema.  Skin:    General: Skin is warm.  Neurological:     Mental Status: She is alert. Mental status is at baseline.     GCS: GCS eye subscore is 4. GCS verbal subscore is 5. GCS motor subscore is 6.     Cranial Nerves: Cranial nerves 2-12 are intact.     Sensory: Sensation is intact.     Motor: Motor function is intact.     Coordination: Coordination is intact.     Gait:  Gait is intact.      IMPRESSION / MDM / ASSESSMENT AND PLAN / ED COURSE  I reviewed the triage vital signs and the nursing notes.  Differential diagnosis including hyperglycemia, DKA, hyperosmolar syndrome, primary headache.  No signs or symptoms concerning for meningitis, no meningismus on exam.  No fever or altered mental status.  Have a low suspicion for giant cell arteritis, vision changes are bilateral, no jaw claudication or temporal artery tenderness.  Will obtain a screening ESR and CRP to further stratify.  No sudden onset of headache not the worst headache of her life have a very low suspicion for subarachnoid hemorrhage.   RADIOLOGY I independently reviewed imaging, my interpretation of imaging: CT scan of the head -no signs of a mass, hydrocephalus or intracranial hemorrhage.  Read as no acute findings.  Old CVA noted.   Labs (all labs ordered are listed, but only abnormal results are displayed) Labs interpreted as -    Labs Reviewed  BASIC METABOLIC PANEL WITH GFR - Abnormal; Notable for the following components:      Result Value   CO2 21 (*)    Glucose, Bld 225 (*)    BUN 27 (*)    Creatinine, Ser 1.27 (*)    Calcium  8.5 (*)    GFR, Estimated 45 (*)    All other components within normal limits  CBG MONITORING, ED - Abnormal; Notable for the following components:   Glucose-Capillary 209 (*)    All other components within normal limits  CBC  SEDIMENTATION RATE  URINALYSIS, ROUTINE W REFLEX MICROSCOPIC  C-REACTIVE PROTEIN  CBG MONITORING, ED  CBG MONITORING, ED   Patient has a normal neurologic exam.  No dysmetria and able to ambulate in the emergency department without any difficulty.  Low suspicion for normal pressure hydrocephalus.  Given Tylenol  for pain control.  Creatinine is at baseline.  Glucose 225.  No leukocytosis.  Repeated glucose.  Given short acting dose of insulin  given her hyperglycemia.  On reevaluation headache is significantly improved.   Clinical picture is not consistent with giant cell arteritis.  Normal intraocular pressures and have low suspicion for acute angle glaucoma.  Bedside ultrasound with no signs of a retinal detachment.  Patient has a follow-up appointment tomorrow with her  ophthalmologist.  Low suspicion for giant cell arteritis.  ESR within normal limits at 8.  CRP is a send out and will not return but have a very low suspicion for giant cell arteritis.  On reevaluation had improvement of her symptoms.  Discussed close follow-up      PROCEDURES:  Critical Care performed: No  Ultrasound ED Ocular  Date/Time: 04/17/2024 10:45 AM  Performed by: Viviano Ground, MD Authorized by: Viviano Ground, MD   PROCEDURE DETAILS:    Indications: visual change     Assessed:  Left eye and right eye   Left eye axial view: obtained     Left eye saggital view: obtained     Images: not archived     Limitations:  None RIGHT EYE FINDINGS:     no evidence of retinal detachment of the right eye    no vitreous hemorrhage in right eye LEFT EYE FINDINGS:     no evidence of retinal detachment of the left eye    no vitreous hemorrhage in left eye   Patient's presentation is most consistent with acute presentation with potential threat to life or bodily function.   MEDICATIONS ORDERED IN ED: Medications  insulin  aspart (novoLOG) injection 5 Units (has no administration in time range)  acetaminophen  (TYLENOL ) tablet 1,000 mg (1,000 mg Oral Given 04/17/24 0940)  tetracaine (PONTOCAINE) 0.5 % ophthalmic solution 1 drop (1 drop Right Eye Given by Other 04/17/24 1024)  fluorescein ophthalmic strip 1 strip (1 strip Both Eyes Given 04/17/24 1025)    FINAL CLINICAL IMPRESSION(S) / ED DIAGNOSES   Final diagnoses:  Hyperglycemia  Acute nonintractable headache, unspecified headache type  Blurry vision, bilateral     Rx / DC Orders   ED Discharge Orders     None        Note:  This document was prepared using Dragon  voice recognition software and may include unintentional dictation errors.   Viviano Ground, MD 04/17/24 1046

## 2024-04-24 ENCOUNTER — Encounter: Payer: Self-pay | Admitting: Gastroenterology

## 2024-05-01 ENCOUNTER — Encounter: Payer: Self-pay | Admitting: Gastroenterology

## 2024-05-01 ENCOUNTER — Ambulatory Visit: Admitting: General Practice

## 2024-05-01 ENCOUNTER — Other Ambulatory Visit: Payer: Self-pay

## 2024-05-01 ENCOUNTER — Encounter
Admission: RE | Disposition: A | Discharge status: Home / Self Care | Payer: Self-pay | Source: Ambulatory Visit | Attending: Gastroenterology

## 2024-05-01 ENCOUNTER — Ambulatory Visit
Admission: RE | Admit: 2024-05-01 | Discharge: 2024-05-01 | Disposition: A | Discharge status: Home / Self Care | Source: Ambulatory Visit | Attending: Gastroenterology | Admitting: Gastroenterology

## 2024-05-01 DIAGNOSIS — K640 First degree hemorrhoids: Secondary | ICD-10-CM | POA: Diagnosis not present

## 2024-05-01 DIAGNOSIS — Z7985 Long-term (current) use of injectable non-insulin antidiabetic drugs: Secondary | ICD-10-CM | POA: Diagnosis not present

## 2024-05-01 DIAGNOSIS — Z7902 Long term (current) use of antithrombotics/antiplatelets: Secondary | ICD-10-CM | POA: Diagnosis not present

## 2024-05-01 DIAGNOSIS — Z1211 Encounter for screening for malignant neoplasm of colon: Secondary | ICD-10-CM | POA: Diagnosis not present

## 2024-05-01 DIAGNOSIS — Z7984 Long term (current) use of oral hypoglycemic drugs: Secondary | ICD-10-CM | POA: Insufficient documentation

## 2024-05-01 DIAGNOSIS — D123 Benign neoplasm of transverse colon: Secondary | ICD-10-CM | POA: Insufficient documentation

## 2024-05-01 DIAGNOSIS — Z800 Family history of malignant neoplasm of digestive organs: Secondary | ICD-10-CM | POA: Insufficient documentation

## 2024-05-01 LAB — GLUCOSE, CAPILLARY: Glucose-Capillary: 177 mg/dL — ABNORMAL HIGH (ref 70–99)

## 2024-05-01 SURGERY — COLONOSCOPY
Anesthesia: General

## 2024-05-01 MED ORDER — PHENYLEPHRINE 80 MCG/ML (10ML) SYRINGE FOR IV PUSH (FOR BLOOD PRESSURE SUPPORT)
PREFILLED_SYRINGE | INTRAVENOUS | Status: AC
  Filled 2024-05-01: qty 10

## 2024-05-01 MED ORDER — PROPOFOL 500 MG/50ML IV EMUL
INTRAVENOUS | Status: DC | PRN
  Administered 2024-05-01: 75 ug/kg/min via INTRAVENOUS

## 2024-05-01 MED ORDER — GLYCOPYRROLATE 0.2 MG/ML IJ SOLN
INTRAMUSCULAR | Status: AC
  Filled 2024-05-01: qty 1

## 2024-05-01 MED ORDER — LIDOCAINE HCL (CARDIAC) PF 100 MG/5ML IV SOSY
PREFILLED_SYRINGE | INTRAVENOUS | Status: DC | PRN
  Administered 2024-05-01: 60 mg via INTRAVENOUS

## 2024-05-01 MED ORDER — PROPOFOL 10 MG/ML IV BOLUS
INTRAVENOUS | Status: DC | PRN
  Administered 2024-05-01: 50 mg via INTRAVENOUS

## 2024-05-01 MED ORDER — LIDOCAINE HCL (PF) 2 % IJ SOLN
INTRAMUSCULAR | Status: AC
  Filled 2024-05-01: qty 5

## 2024-05-01 MED ORDER — PROPOFOL 1000 MG/100ML IV EMUL
INTRAVENOUS | Status: AC
  Filled 2024-05-01: qty 100

## 2024-05-01 MED ORDER — SODIUM CHLORIDE 0.9 % IV SOLN: INTRAVENOUS | Status: DC

## 2024-05-01 NOTE — Transfer of Care (Signed)
 Immediate Anesthesia Transfer of Care Note  Patient: Gina Hudson  Procedure(s) Performed: COLONOSCOPY  Patient Location: PACU  Anesthesia Type:General  Level of Consciousness: sedated  Airway & Oxygen Therapy: Patient Spontanous Breathing  Post-op Assessment: Report given to RN and Post -op Vital signs reviewed and stable  Post vital signs: Reviewed and stable  Last Vitals:  Vitals Value Taken Time  BP    Temp    Pulse 73 05/01/24 1021  Resp 16 05/01/24 1021  SpO2 99 % 05/01/24 1021  Vitals shown include unfiled device data.  Last Pain:  Vitals:   05/01/24 0848  TempSrc: Oral  PainSc: 0-No pain         Complications: No notable events documented.

## 2024-05-01 NOTE — Anesthesia Postprocedure Evaluation (Signed)
 Anesthesia Post Note  Patient: Gina Hudson  Procedure(s) Performed: COLONOSCOPY  Patient location during evaluation: PACU Anesthesia Type: General Level of consciousness: awake and alert Pain management: pain level controlled Vital Signs Assessment: post-procedure vital signs reviewed and stable Respiratory status: spontaneous breathing, nonlabored ventilation, respiratory function stable and patient connected to nasal cannula oxygen Cardiovascular status: blood pressure returned to baseline and stable Postop Assessment: no apparent nausea or vomiting Anesthetic complications: no  No notable events documented.   Last Vitals:  Vitals:   05/01/24 1030 05/01/24 1040  BP: 120/75 139/78  Pulse:    Resp:    Temp:    SpO2:      Last Pain:  Vitals:   05/01/24 1040  TempSrc:   PainSc: 0-No pain                 Enrique Harvest

## 2024-05-01 NOTE — H&P (Signed)
 Pre-Procedure H&P   Patient ID: Gina Hudson is a 73 y.o. female.  Gastroenterology Provider: Quintin Buckle, DO  Referring Provider: Jamee Mazzoni, PA PCP: Gina Degree, FNP  Date: 05/01/2024  HPI [name] is a 73 y.o. female who presents today for Colonoscopy for abdominal pain, fhx colon cancer.  Abdominal pain in the lower quadrants. BM 2x/day  Patient was initially scheduled for egd with her colonoscopy today. Upon reaching endoscopy unit, pt declined EGD. I recommended she undergo it given her abdominal pain and strong fhx of gastric cancer, but she felt she did not need it and declined again. I informed her if she ever changes her mind in the future, then we can pursue this test.  Mother- crc 17s; brother and father gastric cancer in their 45s  CSY 2016- fair prep. Was scheduled for 08/2022 at [facility], however, canceled.  Plavix  held for procedure  Cr 1.27 hgb 13.4 mcv 97 plt 173K   Past Medical History:  Diagnosis Date   Diabetes mellitus without complication (HCC)    type II   Hypertension    Left bundle branch block     Past Surgical History:  Procedure Laterality Date   ABDOMINAL HYSTERECTOMY      Family History Father and brother- gastric ca; mother - crc 68s No other h/o GI disease or malignancy  Review of Systems  Constitutional:  Negative for activity change, appetite change, chills, diaphoresis, fatigue, fever and unexpected weight change.  HENT:  Negative for trouble swallowing and voice change.   Respiratory:  Negative for shortness of breath and wheezing.   Cardiovascular:  Negative for chest pain, palpitations and leg swelling.  Gastrointestinal:  Positive for abdominal pain. Negative for abdominal distention, anal bleeding, blood in stool, constipation, diarrhea, nausea, rectal pain and vomiting.  Musculoskeletal:  Negative for arthralgias and myalgias.  Skin:  Negative for color change and pallor.  Neurological:  Negative  for dizziness, syncope and weakness.  Psychiatric/Behavioral:  Negative for confusion.   All other systems reviewed and are negative.    Medications No current facility-administered medications on file prior to encounter.   Current Outpatient Medications on File Prior to Encounter  Medication Sig Dispense Refill   aspirin  EC 81 MG tablet Take 1 tablet by mouth daily.     atorvastatin  (LIPITOR) 80 MG tablet Take 80 mg by mouth daily.     dapagliflozin  propanediol (FARXIGA ) 10 MG TABS tablet Take 10 mg by mouth daily.     metoprolol  succinate (TOPROL -XL) 100 MG 24 hr tablet Take 1 tablet by mouth daily.     oxybutynin  (DITROPAN -XL) 10 MG 24 hr tablet Take 10 mg by mouth daily.     spironolactone  (ALDACTONE ) 50 MG tablet Take 1 tablet by mouth daily.     telmisartan (MICARDIS) 20 MG tablet Take 20 mg by mouth daily.     azithromycin (ZITHROMAX) 250 MG tablet Take 250 mg by mouth as directed. (Patient not taking: Reported on 12/23/2021)     clopidogrel  (PLAVIX ) 75 MG tablet Take 75 mg by mouth daily.     Insulin  Degludec-Liraglutide  (XULTOPHY ) 100-3.6 UNIT-MG/ML SOPN Inject 32 Units into the skin daily.     KERENDIA  10 MG TABS Take 1 tablet by mouth daily.      Pertinent medications related to GI and procedure were reviewed by me with the patient prior to the procedure   Current Facility-Administered Medications:    0.9 %  sodium chloride infusion, , Intravenous, Continuous, Gina Hudson,  Gina Herter, DO, Last Rate: 20 mL/hr at 05/01/24 0856, New Bag at 05/01/24 0856  sodium chloride 20 mL/hr at 05/01/24 0856       Allergies  Allergen Reactions   Prednisone Other (See Comments)    hyperglycemia   Allergies were reviewed by me prior to the procedure  Objective   Body mass index is 35.85 kg/m. Vitals:   05/01/24 0848  BP: 127/71  Pulse: 62  Resp: 18  Temp: 97.7 F (36.5 C)  TempSrc: Oral  SpO2: 98%  Weight: 88.9 kg  Height: 5\' 2"  (1.575 m)     Physical Exam Vitals and  nursing note reviewed.  Constitutional:      General: She is not in acute distress.    Appearance: Normal appearance. She is obese. She is not ill-appearing, toxic-appearing or diaphoretic.  HENT:     Head: Normocephalic and atraumatic.     Nose: Nose normal.     Mouth/Throat:     Mouth: Mucous membranes are moist.     Pharynx: Oropharynx is clear.  Eyes:     General: No scleral icterus.    Extraocular Movements: Extraocular movements intact.  Cardiovascular:     Rate and Rhythm: Normal rate and regular rhythm.     Heart sounds: Normal heart sounds. No murmur heard.    No friction rub. No gallop.  Pulmonary:     Effort: Pulmonary effort is normal. No respiratory distress.     Breath sounds: Normal breath sounds. No wheezing, rhonchi or rales.  Abdominal:     General: Bowel sounds are normal. There is no distension.     Palpations: Abdomen is soft.     Tenderness: There is no abdominal tenderness. There is no guarding or rebound.  Musculoskeletal:     Cervical back: Neck supple.     Right lower leg: No edema.     Left lower leg: No edema.  Skin:    General: Skin is warm and dry.     Coloration: Skin is not jaundiced or pale.  Neurological:     General: No focal deficit present.     Mental Status: She is alert and oriented to person, place, and time. Mental status is at baseline.  Psychiatric:        Mood and Affect: Mood normal.        Behavior: Behavior normal.        Thought Content: Thought content normal.        Judgment: Judgment normal.      Assessment:  [name] is a 73 y.o. female  who presents today for Colonoscopy for abdominal pain, fhx colon cancer .  Plan:  Colonoscopy with possible intervention today  Colonoscopy with possible biopsy, control of bleeding, polypectomy, and interventions as necessary has been discussed with the patient/patient representative. Informed consent was obtained from the patient/patient representative after explaining the  indication, nature, and risks of the procedure including but not limited to death, bleeding, perforation, missed neoplasm/lesions, cardiorespiratory compromise, and reaction to medications. Opportunity for questions was given and appropriate answers were provided. Patient/patient representative has verbalized understanding is amenable to undergoing the procedure.   Gina Buckle, DO  [facility]  Portions of the record may have been created with voice recognition software. Occasional wrong-word or 'sound-a-like' substitutions may have occurred due to the inherent limitations of voice recognition software.  Read the chart carefully and recognize, using context, where substitutions may have occurred.

## 2024-05-01 NOTE — Interval H&P Note (Signed)
 History and Physical Interval Note: Preprocedure H&P from 05/01/24  was reviewed and there was no interval change after seeing and examining the patient.  Written consent was obtained from the patient after discussion of risks, benefits, and alternatives. Patient has consented to proceed with Colonoscopy with possible intervention   05/01/2024 9:50 AM  [place] La Schrimpf  has presented today for surgery, with the diagnosis of Family history of colon cancer (Z80.0) Family history of gastric cancer (Z80.0) Abdominal pain, generalized (R10.84) Gastroesophageal reflux disease, unspecified whether esophagitis present (K21.9).  The various methods of treatment have been discussed with the patient and family. After consideration of risks, benefits and other options for treatment, the patient has consented to  Procedure(s) with comments: COLONOSCOPY (N/A) - DM EGD (ESOPHAGOGASTRODUODENOSCOPY) (N/A) as a surgical intervention.  The patient's history has been reviewed, patient examined, no change in status, stable for surgery.  I have reviewed the patient's chart and labs.  Questions were answered to the patient's satisfaction.     Quintin Buckle

## 2024-05-01 NOTE — Op Note (Signed)
 [facility] Gastroenterology Patient Name: Gina Hudson Procedure Date: 05/01/2024 9:39 AM MRN: [id number] Account #: [id number] Date of Birth: [birth year] Admit Type: Outpatient Age: 73 Room: [facility] ENDO ROOM 2 Gender: Female Note Status: Supervisor Override Instrument Name: Charlyn Cooley [id number] Procedure:             Colonoscopy Indications:           Screening in patient at increased risk: Family history                         of 1st-degree relative with colorectal cancer before                         age 78 years, Generalized abdominal pain Providers:             Bridgett Camps, DO Referring MD:          Argentina Kugel. Rozetta Corns (Referring MD) Medicines:             Monitored Anesthesia Care Complications:         No immediate complications. Estimated blood loss:                         Minimal. Procedure:             Pre-Anesthesia Assessment:                        - Prior to the procedure, a History and Physical was                         performed, and patient medications and allergies were                         reviewed. The patient is competent. The risks and                         benefits of the procedure and the sedation options and                         risks were discussed with the patient. All questions                         were answered and informed consent was obtained.                         Patient identification and proposed procedure were                         verified by the physician, the nurse, the anesthetist                         and the technician in the endoscopy suite. Mental                         Status Examination: alert and oriented. Airway                         Examination: normal oropharyngeal airway and neck  mobility. Respiratory Examination: clear to                         auscultation. CV Examination: RRR, no murmurs, no S3                         or S4. Prophylactic Antibiotics: The  patient does not                         require prophylactic antibiotics. Prior                         Anticoagulants: The patient has taken Plavix                          (clopidogrel ), last dose was 5 days prior to                         procedure. ASA Grade Assessment: III - A patient with                         severe systemic disease. After reviewing the risks and                         benefits, the patient was deemed in satisfactory                         condition to undergo the procedure. The anesthesia                         plan was to use monitored anesthesia care (MAC).                         Immediately prior to administration of medications,                         the patient was re-assessed for adequacy to receive                         sedatives. The heart rate, respiratory rate, oxygen                         saturations, blood pressure, adequacy of pulmonary                         ventilation, and response to care were monitored                         throughout the procedure. The physical status of the                         patient was re-assessed after the procedure.                        After obtaining informed consent, the colonoscope was                         passed under direct vision. Throughout the procedure,  the patient's blood pressure, pulse, and oxygen                         saturations were monitored continuously. The                         Colonoscope was introduced through the anus and                         advanced to the the cecum, identified by appendiceal                         orifice and ileocecal valve. The colonoscopy was                         performed without difficulty. The patient tolerated                         the procedure well. The quality of the bowel                         preparation was evaluated using the BBPS [facility] Bowel                         Preparation Scale) with scores of:  Right Colon = 3,                         Transverse Colon = 3 and Left Colon = 3 (entire mucosa                         seen well with no residual staining, small fragments                         of stool or opaque liquid). The total BBPS score                         equals 9. The ileocecal valve, appendiceal orifice,                         and rectum were photographed. Findings:      The perianal and digital rectal examinations were normal. Pertinent       negatives include normal sphincter tone.      A 7 to 9 mm polyp was found in the transverse colon. The polyp was       sessile. The polyp was removed with a cold snare. Resection and       retrieval were complete. Estimated blood loss was minimal.      A 1 to 2 mm polyp was found in the transverse colon. The polyp was       sessile. The polyp was removed with a jumbo cold forceps. Resection and       retrieval were complete. Estimated blood loss was minimal.      Non-bleeding internal hemorrhoids were found during retroflexion. The       hemorrhoids were Grade I (internal hemorrhoids that do not prolapse).       Estimated blood loss: none.      The exam was otherwise without abnormality on direct and retroflexion  views. Impression:            - One 7 to 9 mm polyp in the transverse colon, removed                         with a cold snare. Resected and retrieved.                        - One 1 to 2 mm polyp in the transverse colon, removed                         with a jumbo cold forceps. Resected and retrieved.                        - Non-bleeding internal hemorrhoids.                        - The examination was otherwise normal on direct and                         retroflexion views. Recommendation:        - Patient has a contact number available for                         emergencies. The signs and symptoms of potential                         delayed complications were discussed with the patient.                          Return to normal activities tomorrow. Written                         discharge instructions were provided to the patient.                        - Resume previous diet.                        - Continue present medications.                        - No ibuprofen, naproxen, or other non-steroidal                         anti-inflammatory drugs for 5 days after polyp removal.                        - Resume Plavix  (clopidogrel ) at prior dose in 2 days.                         Refer to managing physician for further adjustment of                         therapy.                        - Return to referring physician as previously  scheduled.                        - Recommend H Pylori breath or stool test given family                         history of gastric cancer since patient declined upper                         endoscopy today                        - The findings and recommendations were discussed with                         the patient. Procedure Code(s):     --- Professional ---                        [phone], Colonoscopy, flexible; with removal of                         tumor(s), polyp(s), or other lesion(s) by snare                         technique                        [id number], 59, Colonoscopy, flexible; with biopsy, single                         or multiple Diagnosis Code(s):     --- Professional ---                        Z80.0, Family history of malignant neoplasm of                         digestive organs                        D12.3, Benign neoplasm of transverse colon (hepatic                         flexure or splenic flexure)                        K64.0, First degree hemorrhoids CPT copyright 2022 [employer]. All rights reserved. The codes documented in this report are preliminary and upon coder review may  be revised to meet current compliance requirements. Attending Participation:      I personally performed the  entire procedure. Polo Brisk, DO Quintin Buckle DO, DO 05/01/2024 10:24:13 AM This report has been signed electronically. Number of Addenda: 0 Note Initiated On: 05/01/2024 9:39 AM Scope Withdrawal Time: 0 hours 9 minutes 58 seconds  Total Procedure Duration: 0 hours 16 minutes 3 seconds  Estimated Blood Loss:  Estimated blood loss was minimal.      [facility]

## 2024-05-01 NOTE — Anesthesia Preprocedure Evaluation (Signed)
 Anesthesia Evaluation  Patient identified by MRN, date of birth, ID band Patient awake    Reviewed: Allergy & Precautions, NPO status , Patient's Chart, lab work & pertinent test results  Airway Mallampati: III  TM Distance: >3 FB Neck ROM: full    Dental  (+) Chipped, Dental Advidsory Given   Pulmonary neg pulmonary ROS   Pulmonary exam normal        Cardiovascular hypertension, Normal cardiovascular exam+ dysrhythmias      Neuro/Psych  PSYCHIATRIC DISORDERS Anxiety     negative neurological ROS     GI/Hepatic negative GI ROS, Neg liver ROS,,,  Endo/Other  negative endocrine ROSdiabetes    Renal/GU Renal disease  negative genitourinary   Musculoskeletal   Abdominal   Peds  Hematology negative hematology ROS (+)   Anesthesia Other Findings Past Medical History: No date: Diabetes mellitus without complication (HCC)     Comment:  type II No date: Hypertension No date: Left bundle branch block  Past Surgical History: No date: ABDOMINAL HYSTERECTOMY  BMI    Body Mass Index: 35.85 kg/m      Reproductive/Obstetrics negative OB ROS                             Anesthesia Physical Anesthesia Plan  ASA: 3  Anesthesia Plan: General   Post-op Pain Management: Minimal or no pain anticipated   Induction: Intravenous  PONV Risk Score and Plan: 3 and Propofol infusion, TIVA and Ondansetron  Airway Management Planned: Nasal Cannula  Additional Equipment: None  Intra-op Plan:   Post-operative Plan:   Informed Consent: I have reviewed the patients History and Physical, chart, labs and discussed the procedure including the risks, benefits and alternatives for the proposed anesthesia with the patient or authorized representative who has indicated his/her understanding and acceptance.     Dental advisory given  Plan Discussed with: CRNA and Surgeon  Anesthesia Plan Comments:  (Discussed risks of anesthesia with patient, including possibility of difficulty with spontaneous ventilation under anesthesia necessitating airway intervention, PONV, and rare risks such as cardiac or respiratory or neurological events, and allergic reactions. Discussed the role of CRNA in patient's perioperative care. Patient understands.)       Anesthesia Quick Evaluation

## 2024-05-02 ENCOUNTER — Encounter: Payer: Self-pay | Admitting: Gastroenterology

## 2024-05-02 LAB — SURGICAL PATHOLOGY

## 2024-07-18 ENCOUNTER — Other Ambulatory Visit: Payer: Self-pay | Admitting: Nephrology

## 2024-07-18 DIAGNOSIS — I129 Hypertensive chronic kidney disease with stage 1 through stage 4 chronic kidney disease, or unspecified chronic kidney disease: Secondary | ICD-10-CM

## 2024-07-18 DIAGNOSIS — E1122 Type 2 diabetes mellitus with diabetic chronic kidney disease: Secondary | ICD-10-CM

## 2024-07-18 DIAGNOSIS — R8290 Unspecified abnormal findings in urine: Secondary | ICD-10-CM

## 2024-07-27 ENCOUNTER — Ambulatory Visit: Admission: RE | Admit: 2024-07-27 | Source: Ambulatory Visit
